# Patient Record
Sex: Female | Born: 1960 | Race: White | Hispanic: No | Marital: Married | Smoking: Never smoker
Health system: Southern US, Community
[De-identification: ages and names within clinical notes are randomized; demographics above are authoritative.]

## PROBLEM LIST (undated history)

## (undated) DIAGNOSIS — IMO0001 Reserved for inherently not codable concepts without codable children: Secondary | ICD-10-CM

## (undated) DIAGNOSIS — E88819 Insulin resistance, unspecified: Secondary | ICD-10-CM

## (undated) DIAGNOSIS — K219 Gastro-esophageal reflux disease without esophagitis: Secondary | ICD-10-CM

## (undated) DIAGNOSIS — E236 Other disorders of pituitary gland: Secondary | ICD-10-CM

## (undated) DIAGNOSIS — E8881 Metabolic syndrome: Secondary | ICD-10-CM

## (undated) DIAGNOSIS — N83209 Unspecified ovarian cyst, unspecified side: Secondary | ICD-10-CM

## (undated) DIAGNOSIS — G4730 Sleep apnea, unspecified: Secondary | ICD-10-CM

## (undated) DIAGNOSIS — Z9852 Vasectomy status: Secondary | ICD-10-CM

## (undated) DIAGNOSIS — M1990 Unspecified osteoarthritis, unspecified site: Secondary | ICD-10-CM

## (undated) DIAGNOSIS — G43909 Migraine, unspecified, not intractable, without status migrainosus: Secondary | ICD-10-CM

## (undated) DIAGNOSIS — I10 Essential (primary) hypertension: Secondary | ICD-10-CM

## (undated) HISTORY — PX: CHOLECYSTECTOMY: SHX55

## (undated) HISTORY — DX: Vasectomy status: Z98.52

## (undated) HISTORY — DX: Insulin resistance, unspecified: E88.819

## (undated) HISTORY — PX: HERNIA REPAIR: SHX51

## (undated) HISTORY — DX: Metabolic syndrome: E88.81

## (undated) HISTORY — DX: Essential (primary) hypertension: I10

## (undated) HISTORY — DX: Other disorders of pituitary gland: E23.6

## (undated) HISTORY — DX: Unspecified osteoarthritis, unspecified site: M19.90

## (undated) HISTORY — PX: ABDOMINAL SURGERY: SHX537

## (undated) HISTORY — DX: Unspecified ovarian cyst, unspecified side: N83.209

## (undated) HISTORY — DX: Reserved for inherently not codable concepts without codable children: IMO0001

## (undated) HISTORY — DX: Gastro-esophageal reflux disease without esophagitis: K21.9

---

## 1998-04-26 ENCOUNTER — Other Ambulatory Visit: Admission: RE | Admit: 1998-04-26 | Discharge: 1998-04-26 | Payer: Self-pay | Admitting: Gynecology

## 1998-11-17 ENCOUNTER — Other Ambulatory Visit: Admission: RE | Admit: 1998-11-17 | Discharge: 1998-11-17 | Payer: Self-pay | Admitting: Gynecology

## 1999-04-06 ENCOUNTER — Encounter: Payer: Self-pay | Admitting: Gynecology

## 1999-04-06 ENCOUNTER — Ambulatory Visit (HOSPITAL_COMMUNITY): Admission: RE | Admit: 1999-04-06 | Discharge: 1999-04-06 | Payer: Self-pay | Admitting: Gynecology

## 1999-09-29 ENCOUNTER — Encounter: Payer: Self-pay | Admitting: Family Medicine

## 1999-09-29 ENCOUNTER — Ambulatory Visit (HOSPITAL_COMMUNITY): Admission: RE | Admit: 1999-09-29 | Discharge: 1999-09-29 | Payer: Self-pay | Admitting: Family Medicine

## 2000-04-03 ENCOUNTER — Other Ambulatory Visit: Admission: RE | Admit: 2000-04-03 | Discharge: 2000-04-03 | Payer: Self-pay | Admitting: Gynecology

## 2001-04-30 ENCOUNTER — Other Ambulatory Visit: Admission: RE | Admit: 2001-04-30 | Discharge: 2001-04-30 | Payer: Self-pay | Admitting: Gynecology

## 2002-08-05 ENCOUNTER — Other Ambulatory Visit: Admission: RE | Admit: 2002-08-05 | Discharge: 2002-08-05 | Payer: Self-pay | Admitting: Obstetrics and Gynecology

## 2002-11-24 HISTORY — PX: VAGINAL HYSTERECTOMY: SUR661

## 2002-12-01 ENCOUNTER — Encounter (INDEPENDENT_AMBULATORY_CARE_PROVIDER_SITE_OTHER): Payer: Self-pay

## 2002-12-01 ENCOUNTER — Inpatient Hospital Stay (HOSPITAL_COMMUNITY): Admission: RE | Admit: 2002-12-01 | Discharge: 2002-12-03 | Payer: Self-pay | Admitting: Obstetrics and Gynecology

## 2003-08-19 ENCOUNTER — Other Ambulatory Visit: Admission: RE | Admit: 2003-08-19 | Discharge: 2003-08-19 | Payer: Self-pay | Admitting: Obstetrics and Gynecology

## 2004-08-22 ENCOUNTER — Other Ambulatory Visit: Admission: RE | Admit: 2004-08-22 | Discharge: 2004-08-22 | Payer: Self-pay | Admitting: Obstetrics and Gynecology

## 2005-08-23 ENCOUNTER — Other Ambulatory Visit: Admission: RE | Admit: 2005-08-23 | Discharge: 2005-08-23 | Payer: Self-pay | Admitting: Obstetrics and Gynecology

## 2006-08-24 ENCOUNTER — Other Ambulatory Visit: Admission: RE | Admit: 2006-08-24 | Discharge: 2006-08-24 | Payer: Self-pay | Admitting: Obstetrics and Gynecology

## 2007-09-09 ENCOUNTER — Other Ambulatory Visit: Admission: RE | Admit: 2007-09-09 | Discharge: 2007-09-09 | Payer: Self-pay | Admitting: Obstetrics and Gynecology

## 2008-09-09 ENCOUNTER — Other Ambulatory Visit: Admission: RE | Admit: 2008-09-09 | Discharge: 2008-09-09 | Payer: Self-pay | Admitting: Obstetrics and Gynecology

## 2008-09-09 ENCOUNTER — Encounter: Payer: Self-pay | Admitting: Obstetrics and Gynecology

## 2008-09-09 ENCOUNTER — Ambulatory Visit: Payer: Self-pay | Admitting: Obstetrics and Gynecology

## 2009-09-13 ENCOUNTER — Ambulatory Visit: Payer: Self-pay | Admitting: Obstetrics and Gynecology

## 2009-09-13 ENCOUNTER — Other Ambulatory Visit: Admission: RE | Admit: 2009-09-13 | Discharge: 2009-09-13 | Payer: Self-pay | Admitting: Obstetrics and Gynecology

## 2010-09-27 ENCOUNTER — Ambulatory Visit
Admission: RE | Admit: 2010-09-27 | Discharge: 2010-09-27 | Payer: Self-pay | Source: Home / Self Care | Attending: Obstetrics and Gynecology | Admitting: Obstetrics and Gynecology

## 2010-09-27 ENCOUNTER — Other Ambulatory Visit
Admission: RE | Admit: 2010-09-27 | Discharge: 2010-09-27 | Payer: Self-pay | Source: Home / Self Care | Admitting: Obstetrics and Gynecology

## 2011-02-10 NOTE — Op Note (Signed)
NAME:  Janice Wagner, Janice Wagner                        ACCOUNT NO.:  [id number]   MEDICAL RECORD NO.:  [id number]                   PATIENT TYPE:  INP   LOCATION:  X001                                 FACILITY:  [facility]   PHYSICIAN:  Daniel L. Eda Paschal, M.D.           DATE OF BIRTH:  [birth year]   DATE OF PROCEDURE:  12/01/2002  DATE OF DISCHARGE:                                 OPERATIVE REPORT   PREOPERATIVE DIAGNOSES:  1. Uterine prolapse.  2. Rectocele.   POSTOPERATIVE DIAGNOSES:  1. Uterine prolapse.  2. Rectocele.   OPERATION:  1. Vaginal hysterectomy.  2. Posterior repair.   SURGEON:  Daniel L. Eda Paschal, M.D.   FIRST ASSISTANT:  Ivor Costa. Farrel Gobble, M.D.   FINDINGS:  At the time of surgery, the patient had second-degree uterine  descensus.  Ovaries and tubes were normal.  Pelvic peritoneum was normal.  The patient also has a second-degree rectocele.  Once the uterus was out,  the patient had a completely normal anterior wall, including a good  urethrovesical angle.   DESCRIPTION OF PROCEDURE:  After adequate general endotracheal anesthesia,  the patient was placed in the dorsal lithotomy position and prepped and  draped in the usual sterile manner.  A 1:200,000 solution of epinephrine was  injected around the cervix using 0.5% Xylocaine as a base.  A 360 degree  incision was made around the cervix.  Bladder and posterior peritoneum were  mobilized superiorly.  Posterior peritoneum and vesicouterine fold of  perineum were entered with sharp dissection.  The uterosacral ligaments were  clamped.  In clamping them, they were shortened, and they were sutured to  the vault laterally for good vault support.  Cardinal ligaments, uterine  arteries, balance of the broad ligament, uteroovarian ligaments, round  ligaments, and fallopian tubes were successively clamped, cut, and suture  ligated, and then the uterus was delivered and sent to pathology for tissue  diagnosis.  Suture  material for the above-mentioned pedicles was #1 chromic  catgut.  Copious irrigation was then done with Ringer's lactate.  The  vaginal cuff was sutured to the peritoneum with a running locking 0 Vicryl.  A modified McCall's enterocele prevention suture was placed with 3-0 Vicryl.  The cuff was closed with figure-of-eights of #1 chromic catgut, and the  enterocele prevention suture was tied in place.  The patient had an  excellent anterior wall at this point and therefore, no anterior repair was  done.  The perineum on the patient was also normal, but she had a large  rectocele.  Starting at the mucocutaneous junction, the posterior vaginal  mucosa was undermined to the top of the cuff.  It was sharply dissected free  from the rectocele.  This surgeon put his finger in the rectum to facilitate  dissection.  Once the rectocele was dissected free, it was eliminated with  eight interrupted sutures of 2-0 Vicryl.  Redundant vaginal mucosa was  trimmed away, and then the vaginal mucosa was closed with a running 2-0  Vicryl, also picking up perirectal fascia underneath for elimination of dead  space.  A Foley catheter was then placed  which drained 100 mL of clear urine.  A pack was placed.  The patient  tolerated the procedure well and left the operating room in satisfactory  condition draining clear urine from her Foley catheter.  Estimated blood  loss was 100 mL with none replaced.                                               Daniel L. Eda Paschal, M.D.    Tonette Bihari  D:  12/01/2002  T:  12/01/2002  Job:  [id number]

## 2011-02-10 NOTE — Discharge Summary (Signed)
   NAME:  Janice Wagner, ARKIN                        ACCOUNT NO.:  [id number]   MEDICAL RECORD NO.:  [id number]                   PATIENT TYPE:  INP   LOCATION:  0446                                 FACILITY:  [facility]   PHYSICIAN:  Daniel L. Eda Paschal, M.D.           DATE OF BIRTH:  07-31-1961   DATE OF ADMISSION:  12/01/2002  DATE OF DISCHARGE:  12/03/2002                                 DISCHARGE SUMMARY   HISTORY OF PRESENT ILLNESS:  The patient is a 50 year old female who was  admitted to the hospital with symptomatic uterine prolapse and rectocele for  surgery.   HOSPITAL COURSE:  On the day of admission she underwent a vaginal  hysterectomy, posterior repair.  Postoperatively she had a little trouble  with p.o. intake and, therefore, her IVs were continued.  By the second  postoperative day she was now tolerating p.o. well, she was voiding well,  and she was ready for discharge.   DISCHARGE MEDICATIONS:  Tylox for pain relief and a stool softener.   FOLLOW-UP:  She is to return to the office in three weeks.   PATHOLOGY:  Final pathology report was benign.   CONDITION ON DISCHARGE:  Improved.   DISCHARGE DIAGNOSES:  1. Symptomatic uterine prolapse.  2. Symptomatic rectocele.   OPERATION:  Vaginal hysterectomy, posterior repair.                                               Daniel L. Eda Paschal, M.D.    Tonette Bihari  D:  12/03/2002  T:  12/03/2002  Job:  [id number]
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
## 2011-02-16 ENCOUNTER — Ambulatory Visit (INDEPENDENT_AMBULATORY_CARE_PROVIDER_SITE_OTHER): Payer: BC Managed Care – PPO | Admitting: Obstetrics and Gynecology

## 2011-02-16 DIAGNOSIS — N951 Menopausal and female climacteric states: Secondary | ICD-10-CM

## 2011-11-23 DIAGNOSIS — M1990 Unspecified osteoarthritis, unspecified site: Secondary | ICD-10-CM | POA: Insufficient documentation

## 2011-11-23 DIAGNOSIS — E236 Other disorders of pituitary gland: Secondary | ICD-10-CM | POA: Insufficient documentation

## 2011-11-23 DIAGNOSIS — Z9852 Vasectomy status: Secondary | ICD-10-CM | POA: Insufficient documentation

## 2011-11-23 DIAGNOSIS — E8881 Metabolic syndrome: Secondary | ICD-10-CM | POA: Insufficient documentation

## 2011-11-30 ENCOUNTER — Other Ambulatory Visit (HOSPITAL_COMMUNITY)
Admission: RE | Admit: 2011-11-30 | Discharge: 2011-11-30 | Disposition: A | Discharge status: Home / Self Care | Payer: BC Managed Care – PPO | Source: Ambulatory Visit | Attending: Obstetrics and Gynecology | Admitting: Obstetrics and Gynecology

## 2011-11-30 ENCOUNTER — Ambulatory Visit (INDEPENDENT_AMBULATORY_CARE_PROVIDER_SITE_OTHER): Payer: BC Managed Care – PPO | Admitting: Obstetrics and Gynecology

## 2011-11-30 ENCOUNTER — Encounter: Payer: Self-pay | Admitting: Obstetrics and Gynecology

## 2011-11-30 VITALS — BP 124/76 | Ht 65.0 in | Wt 204.0 lb

## 2011-11-30 DIAGNOSIS — Z01419 Encounter for gynecological examination (general) (routine) without abnormal findings: Secondary | ICD-10-CM

## 2011-11-30 DIAGNOSIS — N83209 Unspecified ovarian cyst, unspecified side: Secondary | ICD-10-CM | POA: Insufficient documentation

## 2011-11-30 MED ORDER — ESTRADIOL 0.05 MG/24HR TD PTTW: 1.0000 | MEDICATED_PATCH | TRANSDERMAL | Status: DC

## 2011-11-30 NOTE — Progress Notes (Signed)
Patient came to see me today for her annual GYN exam. She is happy with her estrogen patch except for skin irritation. She is having no bleeding. She is having no pelvic pain. She is up-to-date on mammograms. She had normal bone density last year. She does her lab through PCP.  HEENT: Within normal limits. Kennon Portela present. Neck: No masses. Supraclavicular lymph nodes: Not enlarged. Breasts: Examined in both sitting and lying position. Symmetrical without skin changes or masses. Abdomen: Soft no masses guarding or rebound. No hernias. Pelvic: External within normal limits. BUS within normal limits. Vaginal examination shows good estrogen effect, no cystocele enterocele or rectocele. Cervix and uterus absent. Adnexa within normal limits. Rectovaginal confirmatory. Extremities within normal limits.  Assessment: Menopausal symptoms relieved with estrogen.  Plan: Switch to mini Vivelle 0.05 mg twice a week  to see if that helps skin irritation. Continue yearly mammograms.

## 2011-12-01 LAB — URINALYSIS W MICROSCOPIC + REFLEX CULTURE
Casts: NONE SEEN
Hgb urine dipstick: NEGATIVE
Leukocytes, UA: NEGATIVE
Nitrite: NEGATIVE
Specific Gravity, Urine: 1.006 (ref 1.005–1.030)
Urobilinogen, UA: 0.2 mg/dL (ref 0.0–1.0)
pH: 6.5 (ref 5.0–8.0)

## 2012-03-07 ENCOUNTER — Encounter: Payer: Self-pay | Admitting: Obstetrics and Gynecology

## 2012-12-30 ENCOUNTER — Other Ambulatory Visit: Payer: Self-pay | Admitting: Obstetrics and Gynecology

## 2012-12-30 NOTE — Telephone Encounter (Signed)
Pt to be called to schedule annual exam KW

## 2014-07-27 ENCOUNTER — Encounter: Payer: Self-pay | Admitting: Obstetrics and Gynecology

## 2020-03-26 ENCOUNTER — Other Ambulatory Visit: Payer: Self-pay | Admitting: Sports Medicine

## 2020-03-26 ENCOUNTER — Ambulatory Visit: Payer: BC Managed Care – PPO | Admitting: Sports Medicine

## 2020-03-26 ENCOUNTER — Ambulatory Visit (INDEPENDENT_AMBULATORY_CARE_PROVIDER_SITE_OTHER): Payer: BC Managed Care – PPO

## 2020-03-26 ENCOUNTER — Encounter: Payer: Self-pay | Admitting: Sports Medicine

## 2020-03-26 ENCOUNTER — Other Ambulatory Visit: Payer: Self-pay

## 2020-03-26 DIAGNOSIS — M7741 Metatarsalgia, right foot: Secondary | ICD-10-CM

## 2020-03-26 DIAGNOSIS — M779 Enthesopathy, unspecified: Secondary | ICD-10-CM

## 2020-03-26 DIAGNOSIS — E119 Type 2 diabetes mellitus without complications: Secondary | ICD-10-CM

## 2020-03-26 DIAGNOSIS — M79671 Pain in right foot: Secondary | ICD-10-CM

## 2020-03-26 DIAGNOSIS — M79672 Pain in left foot: Secondary | ICD-10-CM

## 2020-03-26 MED ORDER — TRIAMCINOLONE ACETONIDE 10 MG/ML IJ SUSP
10.0000 mg | Freq: Once | INTRAMUSCULAR | Status: AC
  Administered 2020-03-26: 10 mg

## 2020-03-26 NOTE — Progress Notes (Signed)
Subjective: Janice Wagner is a 59 y.o. female patient who presents to office for evaluation of right foot pain. Patient complains of progressive pain especially over the last 4 months since March in the right foot at the ball. Ranks pain 3-7/10 and is now interferring with daily activities. Patient has tried nothing with no relief in symptoms. Patient denies any other pedal complaints. Denies known injury/trip/fall/sprain/any causative factors.   Review of Systems  All other systems reviewed and are negative.   Patient Active Problem List   Diagnosis Date Noted  . Ovarian cyst   . Reflux   . Arthritis   . Insulin resistance   . Empty sella syndrome ([place])   . H/O: vasectomy     Current Outpatient Medications on File Prior to Visit  Medication Sig Dispense Refill  . amLODipine (NORVASC) 5 MG tablet Take 5 mg by mouth daily.    . celecoxib (CELEBREX) 200 MG capsule Take 200 mg by mouth daily.    . Cetirizine HCl (ZYRTEC ALLERGY PO) Take by mouth.    . DULoxetine (CYMBALTA) 30 MG capsule Take 30 mg by mouth daily.    Marland Kitchen lisinopril (ZESTRIL) 20 MG tablet Take 20 mg by mouth daily.    . MECLIZINE HCL PO Take by mouth.    . metFORMIN (GLUCOPHAGE-XR) 500 MG 24 hr tablet Take 500 mg by mouth 2 (two) times daily.    Marland Kitchen omeprazole (PRILOSEC) 20 MG capsule Take 20 mg by mouth 2 (two) times daily.    Marland Kitchen topiramate (TOPAMAX) 25 MG tablet Take 25 mg by mouth daily.     No current facility-administered medications on file prior to visit.    Allergies  Allergen Reactions  . Penicillins     Objective:  General: Alert and oriented x3 in no acute distress  Dermatology: No open lesions bilateral lower extremities, no webspace macerations, no ecchymosis bilateral, all nails x 10 are well manicured.  Vascular: Dorsalis Pedis and Posterior Tibial pedal pulses palpable, Capillary Fill Time 3 seconds,(+) pedal hair growth bilateral, no edema bilateral lower extremities, Temperature gradient within  normal limits.  Neurology: Johney Maine sensation intact via light touch bilateral.   Musculoskeletal: Mild tenderness with palpation at ball of the right foot sub met 2, Mild early hammertoe deformity at 2nd toe. Strength within normal limits in all groups bilateral.   Gait: Antalgic gait  Xrays  Right Foot   Impression:Mild 2nd toe deviation, early hammertoe deformity,no fracture, no other acute findings.  Assessment and Plan: Problem List Items Addressed This Visit    None    Visit Diagnoses    Capsulitis    -  Primary   Metatarsalgia, right foot       Right foot pain       Diabetes mellitus without complication (HCC)       Relevant Medications   lisinopril (ZESTRIL) 20 MG tablet   metFORMIN (GLUCOPHAGE-XR) 500 MG 24 hr tablet       -Complete examination performed -Xrays reviewed -Discussed treatment options -After oral consent and aseptic prep, injected a mixture containing 1 ml of 2%  plain lidocaine, 1 ml 0.5% plain marcaine, 0.5 ml of kenalog 10 and 0.5 ml of dexamethasone phosphate into right 2nd mtpj without complication. Post-injection care discussed with patient.  -Dispensed metatarsal padding and good supportive shoes -Advised patient to avoid strenous  -Recommend icing until symptoms improve -Patient to return to office in 1 month or sooner if condition worsens.  Landis Martins, DPM

## 2020-04-09 ENCOUNTER — Other Ambulatory Visit: Payer: Self-pay | Admitting: Sports Medicine

## 2020-04-09 DIAGNOSIS — M779 Enthesopathy, unspecified: Secondary | ICD-10-CM

## 2020-04-23 ENCOUNTER — Other Ambulatory Visit: Payer: Self-pay

## 2020-04-23 ENCOUNTER — Encounter: Payer: Self-pay | Admitting: Sports Medicine

## 2020-04-23 ENCOUNTER — Ambulatory Visit: Payer: BC Managed Care – PPO | Admitting: Sports Medicine

## 2020-04-23 DIAGNOSIS — M7741 Metatarsalgia, right foot: Secondary | ICD-10-CM | POA: Diagnosis not present

## 2020-04-23 DIAGNOSIS — M79671 Pain in right foot: Secondary | ICD-10-CM

## 2020-04-23 DIAGNOSIS — M779 Enthesopathy, unspecified: Secondary | ICD-10-CM

## 2020-04-23 DIAGNOSIS — E119 Type 2 diabetes mellitus without complications: Secondary | ICD-10-CM

## 2020-04-23 DIAGNOSIS — M722 Plantar fascial fibromatosis: Secondary | ICD-10-CM

## 2020-04-23 NOTE — Progress Notes (Signed)
Subjective: Janice Wagner is a 59 y.o. female patient who returns to office for follow up evaluation of right foot pain. Patient reports that pain is much better 1-2/10. Has been resting it is not walking as much using reports pain is much better than it was.  Patient reports that she has been icing as well.  Patient denies any other pedal complaints.    Patient Active Problem List   Diagnosis Date Noted  . Ovarian cyst   . Reflux   . Arthritis   . Insulin resistance   . Empty sella syndrome ([place])   . H/O: vasectomy     Current Outpatient Medications on File Prior to Visit  Medication Sig Dispense Refill  . amLODipine (NORVASC) 5 MG tablet Take 5 mg by mouth daily.    . celecoxib (CELEBREX) 200 MG capsule Take 200 mg by mouth daily.    . Cetirizine HCl (ZYRTEC ALLERGY PO) Take by mouth.    . DULoxetine (CYMBALTA) 30 MG capsule Take 30 mg by mouth daily.    Marland Kitchen lisinopril (ZESTRIL) 20 MG tablet Take 20 mg by mouth daily.    . MECLIZINE HCL PO Take by mouth.    . metFORMIN (GLUCOPHAGE-XR) 500 MG 24 hr tablet Take 500 mg by mouth 2 (two) times daily.    Marland Kitchen omeprazole (PRILOSEC) 20 MG capsule Take 20 mg by mouth 2 (two) times daily.    Marland Kitchen topiramate (TOPAMAX) 25 MG tablet Take 25 mg by mouth daily.     No current facility-administered medications on file prior to visit.    Allergies  Allergen Reactions  . Penicillins     Objective:  General: Alert and oriented x3 in no acute distress  Dermatology: No open lesions bilateral lower extremities, no webspace macerations, no ecchymosis bilateral, all nails x 10 are well manicured.  Vascular: Dorsalis Pedis and Posterior Tibial pedal pulses palpable, Capillary Fill Time 3 seconds,(+) pedal hair growth bilateral, no edema bilateral lower extremities, Temperature gradient within normal limits.  Neurology: Johney Maine sensation intact via light touch bilateral.   Musculoskeletal: No reproducible tenderness with palpation at ball of the right  foot sub met 2, or distal plantar fascia, mild early hammertoe deformity at 2nd toe. Strength within normal limits in all groups bilateral.    Assessment and Plan: Problem List Items Addressed This Visit    None    Visit Diagnoses    Capsulitis    -  Primary   Metatarsalgia, right foot       Right foot pain       Diabetes mellitus without complication (HCC)       Plantar fasciitis of right foot       Distal       -Complete examination performed -Re-discussed care for right foot pain -Recommend custom function foot orthotics to prevent recurrence of symptoms meanwhile continue with metatarsal padding and good supportive shoes -Advised patient may resume walking to tolerance may need to ice afterwards -Patient to return to office for orthotics or sooner if condition worsens.  Landis Martins, DPM

## 2020-05-11 ENCOUNTER — Other Ambulatory Visit: Payer: Self-pay

## 2020-05-11 ENCOUNTER — Other Ambulatory Visit: Payer: BC Managed Care – PPO | Admitting: Orthotics

## 2020-05-11 DIAGNOSIS — E119 Type 2 diabetes mellitus without complications: Secondary | ICD-10-CM | POA: Diagnosis not present

## 2020-05-11 DIAGNOSIS — M722 Plantar fascial fibromatosis: Secondary | ICD-10-CM

## 2020-05-11 DIAGNOSIS — M779 Enthesopathy, unspecified: Secondary | ICD-10-CM | POA: Diagnosis not present

## 2020-07-06 ENCOUNTER — Ambulatory Visit: Payer: BC Managed Care – PPO | Admitting: Orthotics

## 2020-07-06 ENCOUNTER — Other Ambulatory Visit: Payer: Self-pay

## 2020-07-06 DIAGNOSIS — M7741 Metatarsalgia, right foot: Secondary | ICD-10-CM

## 2020-07-06 DIAGNOSIS — M779 Enthesopathy, unspecified: Secondary | ICD-10-CM

## 2020-07-06 NOTE — Progress Notes (Signed)
Patient came in today to pick up custom made foot orthotics.  The goals were accomplished and the patient reported no dissatisfaction with said orthotics.  Patient was advised of breakin period and how to report any issues. 

## 2020-07-07 ENCOUNTER — Other Ambulatory Visit: Payer: BC Managed Care – PPO | Admitting: Orthotics

## 2021-11-03 ENCOUNTER — Other Ambulatory Visit: Payer: Self-pay | Admitting: Specialist

## 2021-11-03 DIAGNOSIS — R296 Repeated falls: Secondary | ICD-10-CM

## 2021-11-03 DIAGNOSIS — K529 Noninfective gastroenteritis and colitis, unspecified: Secondary | ICD-10-CM

## 2021-11-03 DIAGNOSIS — M25551 Pain in right hip: Secondary | ICD-10-CM

## 2021-11-03 DIAGNOSIS — M545 Low back pain, unspecified: Secondary | ICD-10-CM

## 2021-11-03 DIAGNOSIS — K580 Irritable bowel syndrome with diarrhea: Secondary | ICD-10-CM

## 2021-11-03 DIAGNOSIS — R110 Nausea: Secondary | ICD-10-CM

## 2021-11-04 ENCOUNTER — Other Ambulatory Visit: Payer: Self-pay | Admitting: Specialist

## 2021-11-04 DIAGNOSIS — K580 Irritable bowel syndrome with diarrhea: Secondary | ICD-10-CM

## 2021-11-04 DIAGNOSIS — R110 Nausea: Secondary | ICD-10-CM

## 2021-11-19 ENCOUNTER — Ambulatory Visit
Admission: RE | Admit: 2021-11-19 | Discharge: 2021-11-19 | Disposition: A | Discharge status: Home / Self Care | Payer: BC Managed Care – PPO | Source: Ambulatory Visit | Attending: Specialist | Admitting: Specialist

## 2021-11-19 ENCOUNTER — Other Ambulatory Visit: Payer: Self-pay

## 2021-11-19 DIAGNOSIS — R110 Nausea: Secondary | ICD-10-CM

## 2021-11-19 DIAGNOSIS — M25552 Pain in left hip: Secondary | ICD-10-CM

## 2021-11-19 DIAGNOSIS — M545 Low back pain, unspecified: Secondary | ICD-10-CM

## 2021-11-19 DIAGNOSIS — M25551 Pain in right hip: Secondary | ICD-10-CM

## 2021-11-19 DIAGNOSIS — K529 Noninfective gastroenteritis and colitis, unspecified: Secondary | ICD-10-CM

## 2021-11-19 DIAGNOSIS — K580 Irritable bowel syndrome with diarrhea: Secondary | ICD-10-CM

## 2021-11-19 DIAGNOSIS — R296 Repeated falls: Secondary | ICD-10-CM

## 2021-11-19 MED ORDER — GADOBENATE DIMEGLUMINE 529 MG/ML IV SOLN
20.0000 mL | Freq: Once | INTRAVENOUS | Status: AC | PRN
  Administered 2021-11-19: 20 mL via INTRAVENOUS

## 2021-11-25 ENCOUNTER — Encounter: Payer: Self-pay | Admitting: Gastroenterology

## 2021-12-26 ENCOUNTER — Ambulatory Visit: Payer: BC Managed Care – PPO | Admitting: Gastroenterology

## 2022-06-19 ENCOUNTER — Other Ambulatory Visit: Payer: Self-pay | Admitting: Radiology

## 2022-06-20 ENCOUNTER — Encounter: Payer: Self-pay | Admitting: Specialist

## 2022-06-21 ENCOUNTER — Telehealth: Payer: Self-pay | Admitting: Hematology

## 2022-06-21 NOTE — Telephone Encounter (Signed)
Spoke to patient to confirm afternoon clinic appointment for 10/4, paperwork will be sent by [facility]

## 2022-06-26 ENCOUNTER — Encounter: Payer: Self-pay | Admitting: *Deleted

## 2022-06-26 DIAGNOSIS — C50411 Malignant neoplasm of upper-outer quadrant of right female breast: Secondary | ICD-10-CM | POA: Insufficient documentation

## 2022-06-26 NOTE — Progress Notes (Signed)
Radiation Oncology         [phone] ________________________________  Name: Janice Wagner        MRN: [id number]  Date of Service: 06/28/2022 DOB: 07/07/1961  [id number], Clarita Crane., MD  Coralie Keens, MD     REFERRING PHYSICIAN: Coralie Keens, MD   DIAGNOSIS: The encounter diagnosis was Malignant neoplasm of upper-outer quadrant of right breast in female, estrogen receptor positive ([place]).   HISTORY OF PRESENT ILLNESS: Janice Wagner is a 61 y.o. female seen in the multidisciplinary breast clinic for a new diagnosis of right breast cancer. The patient was noted to have a screening detected mass in the right breast.  She underwent further diagnostic work-up including ultrasound which identified a 1.8 cm mass with microlobulated margin in the right breast at the 12 o'clock position and an adjacent 5 mm mass also in the 12 o'clock position was noted.  There was also a 1.7 cm oval mass with microlobulated margin in the right breast in the 9-9 30 o'clock position and superior to this a 7 mm mass from 9 30-10 o'clock.  There were 2 additional small 4 and 5 mm hypoechoic masses between 9-10 o'clock.  The axilla was negative for adenopathy.  She underwent a biopsy on 06/19/2022 the 12:00 specimen showed a grade 2 invasive ductal carcinoma with microcalcifications, this was ER/PR positive, HER2 was negative with a Ki-67 of 10%. The  9:30 biopsy showed benign breast tissue with fibrocystic changes stromal fibrosis and usual ductal hyperplasia with posh no evidence of malignancy was present.  The 10:00 lesion was also compatible with a benign fibroadenoma with rare microcalcifications present negative for malignancy.  She is seen today to discuss treatment recommendations for her cancer.Marland Kitchen    PREVIOUS RADIATION THERAPY: {EXAM; YES/NO:19492::"No"}   PAST MEDICAL HISTORY:  Past Medical History:  Diagnosis Date   Arthritis    Empty sella syndrome ([place])    H/O: vasectomy    husband with  vasectomy   Insulin resistance    Ovarian cyst    Reflux        PAST SURGICAL HISTORY: Past Surgical History:  Procedure Laterality Date   ABDOMINAL SURGERY     Laparotomy-Ovarian cyst   CHOLECYSTECTOMY     HERNIA REPAIR     VAGINAL HYSTERECTOMY  11/2002   VAGINAL HYST., POSTERIOR REPAIR     FAMILY HISTORY:  Family History  Problem Relation Age of Onset   Breast cancer Maternal Grandmother        Age 76   Hypertension Maternal Grandfather    Cancer Maternal Uncle        Colon cancer     SOCIAL HISTORY:  reports that she has never smoked. She does not have any smokeless tobacco history on file. She reports current alcohol use.  The patient is married and lives in [place].   ALLERGIES: Penicillins   MEDICATIONS:  Current Outpatient Medications  Medication Sig Dispense Refill   amLODipine (NORVASC) 5 MG tablet Take 5 mg by mouth daily.     celecoxib (CELEBREX) 200 MG capsule Take 200 mg by mouth daily.     Cetirizine HCl (ZYRTEC ALLERGY PO) Take by mouth.     DULoxetine (CYMBALTA) 30 MG capsule Take 30 mg by mouth daily.     lisinopril (ZESTRIL) 20 MG tablet Take 20 mg by mouth daily.     MECLIZINE HCL PO Take by mouth.     metFORMIN (GLUCOPHAGE-XR) 500 MG 24 hr tablet Take 500  mg by mouth 2 (two) times daily.     omeprazole (PRILOSEC) 20 MG capsule Take 20 mg by mouth 2 (two) times daily.     topiramate (TOPAMAX) 25 MG tablet Take 25 mg by mouth daily.     No current facility-administered medications for this visit.     REVIEW OF SYSTEMS: On review of systems, the patient reports that she is doing ***     PHYSICAL EXAM:  Wt Readings from Last 3 Encounters:  11/30/11 204 lb (92.5 kg)   Temp Readings from Last 3 Encounters:  No data found for Temp   BP Readings from Last 3 Encounters:  11/30/11 124/76   Pulse Readings from Last 3 Encounters:  No data found for Pulse  ***  In general this is a well appearing *** female in no acute distress.  She's alert and oriented x4 and appropriate throughout the examination. Cardiopulmonary assessment is negative for acute distress and she exhibits normal effort. Bilateral breast exam is deferred.    ECOG = ***  0 - Asymptomatic (Fully active, able to carry on all predisease activities without restriction)  1 - Symptomatic but completely ambulatory (Restricted in physically strenuous activity but ambulatory and able to carry out work of a light or sedentary nature. For example, light housework, office work)  2 - Symptomatic, <50% in bed during the day (Ambulatory and capable of all self care but unable to carry out any work activities. Up and about more than 50% of waking hours)  3 - Symptomatic, >50% in bed, but not bedbound (Capable of only limited self-care, confined to bed or chair 50% or more of waking hours)  4 - Bedbound (Completely disabled. Cannot carry on any self-care. Totally confined to bed or chair)  5 - Death   Eustace Pen MM, Creech RH, Tormey DC, et al. [phone]). "Toxicity and response criteria of the [facility]". [place] Oncol. 5 (6): 649-55    LABORATORY DATA:  No results found for: "WBC", "HGB", "HCT", "MCV", "PLT" No results found for: "NA", "K", "CL", "CO2" No results found for: "ALT", "AST", "GGT", "ALKPHOS", "BILITOT"    RADIOGRAPHY: No results found.     IMPRESSION/PLAN: 1. Stage IA, cT1cN0M0, grade 2, ER/PR positive invasive ductal carcinoma of the right breast. Dr. Lisbeth Renshaw discusses the pathology findings and reviews the nature of early stage right breast disease. The consensus from the breast conference includes breast conservation with lumpectomy with *** sentinel node biopsy. Dr. Burr Medico anticipates Oncotype Dx score to determine a role for systemic therapy. Provided that chemotherapy is not indicated, the patient's course would then be followed by external radiotherapy to the breast  to reduce risks of local recurrence followed by  antiestrogen therapy. We discussed the risks, benefits, short, and long term effects of radiotherapy, as well as the curative intent, and the patient is interested in proceeding. Dr. Lisbeth Renshaw discusses the delivery and logistics of radiotherapy and anticipates a course of 4 or up to 6 1/2 weeks of radiotherapy to the right breast. We will see her back a few weeks after surgery to discuss the simulation process and anticipate we starting radiotherapy about 4-6 weeks after surgery.  2. Possible genetic predisposition to malignancy. The patient is a candidate for genetic testing given her personal and family history. She will meet with our geneticist today in clinic.   In a visit lasting *** minutes, greater than 50% of the time was spent face to face reviewing her case, as well  as in preparation of, discussing, and coordinating the patient's care.  The above documentation reflects my direct findings during this shared patient visit. Please see the separate note by Dr. Lisbeth Renshaw on this date for the remainder of the patient's plan of care.    Carola Rhine, [facility]    **Disclaimer: This note was dictated with voice recognition software. Similar sounding words can inadvertently be transcribed and this note may contain transcription errors which may not have been corrected upon publication of note.**

## 2022-06-28 ENCOUNTER — Ambulatory Visit
Admission: RE | Admit: 2022-06-28 | Discharge: 2022-06-28 | Disposition: A | Discharge status: Home / Self Care | Payer: BC Managed Care – PPO | Source: Ambulatory Visit | Attending: Radiation Oncology | Admitting: Radiation Oncology

## 2022-06-28 ENCOUNTER — Encounter: Payer: Self-pay | Admitting: Emergency Medicine

## 2022-06-28 ENCOUNTER — Inpatient Hospital Stay (HOSPITAL_BASED_OUTPATIENT_CLINIC_OR_DEPARTMENT_OTHER): Payer: BC Managed Care – PPO | Admitting: Genetic Counselor

## 2022-06-28 ENCOUNTER — Encounter: Payer: Self-pay | Admitting: Genetic Counselor

## 2022-06-28 ENCOUNTER — Inpatient Hospital Stay: Payer: BC Managed Care – PPO | Attending: Hematology

## 2022-06-28 ENCOUNTER — Encounter: Payer: Self-pay | Admitting: Physical Therapy

## 2022-06-28 ENCOUNTER — Other Ambulatory Visit: Payer: Self-pay

## 2022-06-28 ENCOUNTER — Other Ambulatory Visit: Payer: Self-pay | Admitting: Surgery

## 2022-06-28 ENCOUNTER — Ambulatory Visit: Payer: BC Managed Care – PPO | Attending: Surgery | Admitting: Physical Therapy

## 2022-06-28 ENCOUNTER — Encounter: Payer: Self-pay | Admitting: Hematology

## 2022-06-28 ENCOUNTER — Inpatient Hospital Stay (HOSPITAL_BASED_OUTPATIENT_CLINIC_OR_DEPARTMENT_OTHER): Payer: BC Managed Care – PPO | Admitting: Hematology

## 2022-06-28 VITALS — BP 139/77 | HR 85 | Temp 97.8°F | Ht 65.0 in | Wt 215.5 lb

## 2022-06-28 DIAGNOSIS — Z803 Family history of malignant neoplasm of breast: Secondary | ICD-10-CM

## 2022-06-28 DIAGNOSIS — Z853 Personal history of malignant neoplasm of breast: Secondary | ICD-10-CM

## 2022-06-28 DIAGNOSIS — Z800 Family history of malignant neoplasm of digestive organs: Secondary | ICD-10-CM | POA: Diagnosis not present

## 2022-06-28 DIAGNOSIS — C50411 Malignant neoplasm of upper-outer quadrant of right female breast: Secondary | ICD-10-CM | POA: Insufficient documentation

## 2022-06-28 DIAGNOSIS — Z170 Estrogen receptor positive status [ER+]: Secondary | ICD-10-CM

## 2022-06-28 DIAGNOSIS — Z90710 Acquired absence of both cervix and uterus: Secondary | ICD-10-CM

## 2022-06-28 DIAGNOSIS — I10 Essential (primary) hypertension: Secondary | ICD-10-CM | POA: Insufficient documentation

## 2022-06-28 DIAGNOSIS — R293 Abnormal posture: Secondary | ICD-10-CM | POA: Insufficient documentation

## 2022-06-28 HISTORY — DX: Family history of malignant neoplasm of digestive organs: Z80.0

## 2022-06-28 HISTORY — DX: Family history of malignant neoplasm of breast: Z80.3

## 2022-06-28 LAB — CBC WITH DIFFERENTIAL (CANCER CENTER ONLY)
Abs Immature Granulocytes: 0.03 10*3/uL (ref 0.00–0.07)
Basophils Absolute: 0.1 10*3/uL (ref 0.0–0.1)
Basophils Relative: 1 %
Eosinophils Absolute: 0.1 10*3/uL (ref 0.0–0.5)
Eosinophils Relative: 1 %
HCT: 39.6 % (ref 36.0–46.0)
Hemoglobin: 12.6 g/dL (ref 12.0–15.0)
Immature Granulocytes: 0 %
Lymphocytes Relative: 17 %
Lymphs Abs: 1.7 10*3/uL (ref 0.7–4.0)
MCH: 26.5 pg (ref 26.0–34.0)
MCHC: 31.8 g/dL (ref 30.0–36.0)
MCV: 83.2 fL (ref 80.0–100.0)
Monocytes Absolute: 0.6 10*3/uL (ref 0.1–1.0)
Monocytes Relative: 6 %
Neutro Abs: 7.3 10*3/uL (ref 1.7–7.7)
Neutrophils Relative %: 75 %
Platelet Count: 386 10*3/uL (ref 150–400)
RBC: 4.76 MIL/uL (ref 3.87–5.11)
RDW: 15.2 % (ref 11.5–15.5)
WBC Count: 9.7 10*3/uL (ref 4.0–10.5)
nRBC: 0 % (ref 0.0–0.2)

## 2022-06-28 LAB — CMP (CANCER CENTER ONLY)
ALT: 15 U/L (ref 0–44)
AST: 18 U/L (ref 15–41)
Albumin: 4.3 g/dL (ref 3.5–5.0)
Alkaline Phosphatase: 104 U/L (ref 38–126)
Anion gap: 7 (ref 5–15)
BUN: 13 mg/dL (ref 6–20)
CO2: 26 mmol/L (ref 22–32)
Calcium: 9.1 mg/dL (ref 8.9–10.3)
Chloride: 106 mmol/L (ref 98–111)
Creatinine: 1.15 mg/dL — ABNORMAL HIGH (ref 0.44–1.00)
GFR, Estimated: 55 mL/min — ABNORMAL LOW (ref >60)
Glucose, Bld: 137 mg/dL — ABNORMAL HIGH (ref 70–99)
Potassium: 3.9 mmol/L (ref 3.5–5.1)
Sodium: 139 mmol/L (ref 135–145)
Total Bilirubin: 0.7 mg/dL (ref 0.3–1.2)
Total Protein: 6.7 g/dL (ref 6.5–8.1)

## 2022-06-28 LAB — GENETIC SCREENING ORDER

## 2022-06-28 NOTE — Research (Signed)
[employer] 2021-05 - Specimen Collection Study to Evaluate Biomarkers in Subjects with Cancer   INTRO STUDY/CONSENT  Patient Janice Wagner was identified by Clinical Research as a potential candidate for the above listed study.  This Clinical Research Nurse met with DENECE SHEARER, [id number], on 06/28/22 in a manner and location that ensures patient privacy to discuss participation in the above listed research study.  Patient is Accompanied by her husband and daughter .  A copy of the informed consent document with embedded HIPAA language was provided to the patient.  Patient reads, speaks, and understands [place].   Patient was provided with the business card of this Nurse and encouraged to contact the research team with any questions.  Approximately 20 minutes were spent with the patient reviewing the informed consent documents.  Patient was provided the option of taking informed consent documents home to review and was encouraged to review at their convenience with their support network, including other care providers. Patient took the consent documents home to review.  Will f/u with pt in next few days to determine interest.  [name] 'Donell Sievert, RN, BSN Clinical Research Nurse I 06/28/22 3:48 PM

## 2022-06-28 NOTE — Progress Notes (Signed)
REFERRING PROVIDER: Malachy Mood, MD [address] [place],  [place] [address]  PRIMARY PROVIDER:  Gordan Payment., MD  PRIMARY REASON FOR VISIT:  1. Malignant neoplasm of upper-outer quadrant of right breast in female, estrogen receptor positive (HCC)   2. Family history of breast cancer   3. Family history of colon cancer      HISTORY OF PRESENT ILLNESS:   Janice Wagner, a 61 y.o. female, was seen for a [facility] cancer genetics consultation during the breast multidisciplinary clinic at the request of Dr. Mosetta Putt due to a personal and family history of breast cancer.  Janice Wagner presents to clinic today to discuss the possibility of a hereditary predisposition to cancer, to discuss genetic testing, and to further clarify her future cancer risks, as well as potential cancer risks for family members.   In 2023, at the age of 61, Janice Wagner was diagnosed with invasive ductal carcinoma of the right breast (ER+/PR+/HER2-). The treatment plan is pending.   CANCER HISTORY:  Oncology History  Malignant neoplasm of upper-outer quadrant of right breast in female, estrogen receptor positive (HCC)  06/19/2022 Initial Biopsy   Diagnosis 1. Breast, right, needle core biopsy, 12 o'clock, 2cmfn INVASIVE MODERATELY DIFFERENTIATED DUCTAL ADENOCARCINOMA, GRADE 2 (3+2+1) MICROCALCIFICATIONS PRESENT NEGATIVE FOR LYMPHOVASCULAR INVASION TUMOR MEASURES 6 MM IN GREATEST LINEAR EXTENT 2. Breast, right, needle core biopsy, 9:30 o'clock, 6cmfn BENIGN BREAST WITH FIBROCYSTIC CHANGES INCLUDING STROMAL FIBROSIS AND USUAL DUCT HYPERPLASIA PSEUDOANGIOMATOUS STROMAL HYPERPLASIA (PASH) NEGATIVE FOR MICROCALCIFICATIONS NEGATIVE FOR CARCINOMA 3. Breast, right, needle core biopsy, 10 o'clock, 8cmfn COMPATIBLE WITH A BENIGN FIBROADENOMA RARE MICROCALCIFICATIONS PRESENT NEGATIVE FOR MALIGNANCY Diagnosis Note 1. An immunohistochemical stain for E-cadherin is performed with adequate control and is positive within the  tumor supporting a ductal differentiation.  1. PROGNOSTIC INDICATORS Results: The tumor cells are equivocal for Her2 (2+). Her2 by FISH will be performed and the results reported separately. Estrogen Receptor: 100%, POSITIVE, STRONG STAINING INTENSITY Progesterone Receptor: 90%, POSITIVE, STRONG STAINING INTENSITY Proliferation Marker Ki67: 10%  1. FLUORESCENCE IN-SITU HYBRIDIZATION Results: GROUP 5: HER2 **NEGATIVE**   06/26/2022 Initial Diagnosis   Malignant neoplasm of upper-outer quadrant of right breast in female, estrogen receptor positive (HCC)   06/26/2022 Cancer Staging   Staging form: Breast, AJCC 8th Edition - Clinical: Stage IA (cT1c, cN0, cM0, G2, ER+, PR+, HER2-) - Signed by Ronny Bacon, PA-C on 06/26/2022 Stage prefix: Initial diagnosis Method of lymph node assessment: Clinical Histologic grading system: 3 grade system       Past Medical History:  Diagnosis Date   Arthritis    Empty sella syndrome (HCC)    Family history of breast cancer 06/28/2022   Family history of colon cancer 06/28/2022   H/O: vasectomy    husband with vasectomy   Hypertension    Insulin resistance    Ovarian cyst    Reflux     Past Surgical History:  Procedure Laterality Date   ABDOMINAL SURGERY     Laparotomy-Ovarian cyst   CHOLECYSTECTOMY     HERNIA REPAIR     VAGINAL HYSTERECTOMY  11/2002   VAGINAL HYST., POSTERIOR REPAIR     FAMILY HISTORY:  We obtained a detailed, 4-generation family history.  Significant diagnoses are listed below: Family History  Problem Relation Age of Onset   Colon polyps Mother        ~10   Colon cancer Maternal Uncle 70   Skin cancer Maternal Uncle    Breast cancer Maternal Grandmother 33  Hypertension Maternal Grandfather    Breast cancer Other        MGM's sisters x2; dx after 68     Janice Wagner is unaware of previous family history of genetic testing for hereditary cancer risks. There is no reported Ashkenazi Jewish ancestry.  There is no known consanguinity.  GENETIC COUNSELING ASSESSMENT: Janice Wagner is a 61 y.o. female with a personal and family history of cancer which is somewhat suggestive of a hereditary cancer syndrome and predisposition to cancer given the presence of breast cancer in multiple generations of the family. We, therefore, discussed and recommended the following at today's visit.   DISCUSSION: We discussed that 5 - 10% of cancer is hereditary, with most cases of hereditary breast cancer associated with mutations in BRCA1/2.  There are other genes that can be associated with hereditary breast cancer syndromes.  Type of cancer risk and level of risk are gene-specific. We discussed that testing is beneficial for several reasons including knowing how to follow individuals after completing their treatment, identifying whether potential treatment options would be beneficial, and understanding if other family members could be at risk for cancer and allowing them to undergo genetic testing.   We reviewed the characteristics, features and inheritance patterns of hereditary cancer syndromes. We also discussed genetic testing, including the appropriate family members to test, the process of testing, insurance coverage and turn-around-time for results. We discussed the implications of a negative, positive and/or variant of uncertain significant result. In order to get genetic test results in a timely manner so that Janice Wagner can use these genetic test results for surgical decisions, we recommended Janice Wagner pursue genetic testing for the [facility] STAT BRCAPlus Panel. The BRCAplus panel offered by [employer] and includes sequencing and deletion/duplication analysis for the following 8 genes: ATM, BRCA1, BRCA2, CDH1, CHEK2, PALB2, PTEN, and TP53.  Once complete, we recommend Janice Wagner pursue reflex genetic testing to a more comprehensive gene panel.   The CancerNext-Expanded gene panel offered by [facility] and  includes sequencing, rearrangement, and RNA analysis for the following 77 genes: AIP, ALK, APC, ATM, AXIN2, BAP1, BARD1, BLM, BMPR1A, BRCA1, BRCA2, BRIP1, CDC73, CDH1, CDK4, CDKN1B, CDKN2A, CHEK2, CTNNA1, DICER1, FANCC, FH, FLCN, GALNT12, KIF1B, LZTR1, MAX, MEN1, MET, MLH1, MSH2, MSH3, MSH6, MUTYH, NBN, NF1, NF2, NTHL1, PALB2, PHOX2B, PMS2, POT1, PRKAR1A, PTCH1, PTEN, RAD51C, RAD51D, RB1, RECQL, RET, SDHA, SDHAF2, SDHB, SDHC, SDHD, SMAD4, SMARCA4, SMARCB1, SMARCE1, STK11, SUFU, TMEM127, TP53, TSC1, TSC2, VHL and XRCC2 (sequencing and deletion/duplication); EGFR, EGLN1, HOXB13, KIT, MITF, PDGFRA, POLD1, and POLE (sequencing only); EPCAM and GREM1 (deletion/duplication only).   Based on Janice Wagner's personal and family history of breast cancer, she meets medical criteria for genetic testing. Despite that she meets criteria, she may still have an out of pocket cost. We discussed that if her out of pocket cost for testing is over $100, the laboratory should contact them to discuss self-pay prices, patient pay assistance programs, if applicable, and other billing options.   PLAN: After considering the risks, benefits, and limitations, Janice Wagner provided informed consent to pursue genetic testing and the blood sample was sent to [facility] for analysis of the BRCAPlus and CancerNext-Expanded +RNAinsight Panel. Results should be available within approximately 1-2 weeks' time, at which point they will be disclosed by telephone to Janice Wagner, as will any additional recommendations warranted by these results. Janice Wagner will receive a summary of her genetic counseling visit and a copy of her results once available. This information  will also be available in Epic.   Janice Wagner questions were answered to her satisfaction today. Our contact information was provided should additional questions or concerns arise. Thank you for the referral and allowing [place] to share in the care of your patient.   Breon Diss M. Joette Catching,  [place], [facility] Genetic Counselor [email or web address] ([phone]  The patient was seen for a total of 20 minutes in face-to-face  genetic counseling.   Patient was accompanied by her daughter and husband.  Dr Burr Medico was available to discuss this case as needed.  _______________________________________________________________________ For Office Staff:  Number of people involved in session: 3 Was an Intern/ student involved with case: no

## 2022-06-28 NOTE — Therapy (Signed)
OUTPATIENT PHYSICAL THERAPY BREAST CANCER BASELINE EVALUATION   Patient Name: Janice Wagner MRN: [id number] DOB:[birth year], 61 y.o., female Today's Date: 06/28/2022   PT End of Session - 06/28/22 1241     Visit Number 1    Number of Visits 2    Date for PT Re-Evaluation 08/23/22    PT Start Time 1410    PT Stop Time 1437    PT Time Calculation (min) 27 min    Activity Tolerance Patient tolerated treatment well    Behavior During Therapy WFL for tasks assessed/performed             Past Medical History:  Diagnosis Date   Arthritis    Empty sella syndrome ([place])    H/O: vasectomy    husband with vasectomy   Hypertension    Insulin resistance    Ovarian cyst    Reflux    Past Surgical History:  Procedure Laterality Date   ABDOMINAL SURGERY     Laparotomy-Ovarian cyst   CHOLECYSTECTOMY     HERNIA REPAIR     VAGINAL HYSTERECTOMY  11/2002   VAGINAL HYST., POSTERIOR REPAIR   Patient Active Problem List   Diagnosis Date Noted   Malignant neoplasm of upper-outer quadrant of right breast in female, estrogen receptor positive ([place]) 06/26/2022   Ovarian cyst    Reflux    Arthritis    Insulin resistance    Empty sella syndrome ([place])    H/O: vasectomy     REFERRING PROVIDER: Dr. Coralie Keens  REFERRING DIAG: Right breast cancer  THERAPY DIAG:  Malignant neoplasm of upper-outer quadrant of right breast in female, estrogen receptor positive ([place])  Abnormal posture  Rationale for Evaluation and Treatment Rehabilitation  ONSET DATE: 06/07/2022  SUBJECTIVE                                                                                                                                                                                           SUBJECTIVE STATEMENT: Patient reports she is here today to be seen by her medical team for her newly diagnosed right breast cancer.   PERTINENT HISTORY:  Patient was diagnosed on 06/07/2022 with right grade 2 invasive ductal  carcinoma breast cancer. It measures 1.8 cm and is located in the upper outer quadrant. It is ER/PR positive and HER negative with a Ki67 of 10%.   PATIENT GOALS   reduce lymphedema risk and learn post op HEP.   PAIN:  Are you having pain? No   PRECAUTIONS: Active CA   HAND DOMINANCE: right  WEIGHT BEARING RESTRICTIONS No  FALLS:  Has patient fallen in last 6  months? No  LIVING ENVIRONMENT: Patient lives with: her husband Lives in: House/apartment Has following equipment at home: None  OCCUPATION: Retired  LEISURE: She does not exercise  PRIOR LEVEL OF FUNCTION: Independent   OBJECTIVE  COGNITION:  Overall cognitive status: Within functional limits for tasks assessed    POSTURE:  Forward head and rounded shoulders posture  UPPER EXTREMITY AROM/PROM:  A/PROM RIGHT   eval   Shoulder extension 49  Shoulder flexion 162  Shoulder abduction 167  Shoulder internal rotation 63  Shoulder external rotation 89    (Blank rows = not tested)  A/PROM LEFT   eval  Shoulder extension 47  Shoulder flexion 155  Shoulder abduction 160  Shoulder internal rotation 59  Shoulder external rotation 90    (Blank rows = not tested)   CERVICAL AROM: All within normal limits  UPPER EXTREMITY STRENGTH: WFL   LYMPHEDEMA ASSESSMENTS:   LANDMARK RIGHT   eval  10 cm proximal to olecranon process 32.7  Olecranon process 25.7  10 cm proximal to ulnar styloid process 24.3  Just proximal to ulnar styloid process 16.2  Across hand at thumb web space 17.9  At base of 2nd digit 6.4  (Blank rows = not tested)  LANDMARK LEFT   eval  10 cm proximal to olecranon process 32.2  Olecranon process 25.7  10 cm proximal to ulnar styloid process 22.7  Just proximal to ulnar styloid process 15.6  Across hand at thumb web space 17.7  At base of 2nd digit 6.1  (Blank rows = not tested)   L-DEX LYMPHEDEMA SCREENING:  The patient was assessed using the L-Dex machine today to produce a  lymphedema index baseline score. The patient will be reassessed on a regular basis (typically every 3 months) to obtain new L-Dex scores. If the score is > 6.5 points away from his/her baseline score indicating onset of subclinical lymphedema, it will be recommended to wear a compression garment for 4 weeks, 12 hours per day and then be reassessed. If the score continues to be > 6.5 points from baseline at reassessment, we will initiate lymphedema treatment. Assessing in this manner has a 95% rate of preventing clinically significant lymphedema.   L-DEX FLOWSHEETS - 06/28/22 1200       L-DEX LYMPHEDEMA SCREENING   Measurement Type Unilateral    L-DEX MEASUREMENT EXTREMITY Upper Extremity    POSITION  Standing    DOMINANT SIDE Right    At Risk Side Right    BASELINE SCORE (UNILATERAL) 5.1              QUICK DASH SURVEY:  Katina Dung - 06/28/22 0001     Open a tight or new jar Mild difficulty    Do heavy household chores (wash walls, wash floors) No difficulty    Carry a shopping bag or briefcase No difficulty    Wash your back No difficulty    Use a knife to cut food No difficulty    Recreational activities in which you take some force or impact through your arm, shoulder, or hand (golf, hammering, tennis) No difficulty    During the past week, to what extent has your arm, shoulder or hand problem interfered with your normal social activities with family, friends, neighbors, or groups? Not at all    During the past week, to what extent has your arm, shoulder or hand problem limited your work or other regular daily activities Not at all    Arm, shoulder, or hand pain.  Mild    Tingling (pins and needles) in your arm, shoulder, or hand Mild    Difficulty Sleeping Mild difficulty    DASH Score 9.09 %              PATIENT EDUCATION:  Education details: Lymphedema risk reduction and post op shoulder/posture HEP Person educated: Patient Education method: Explanation,  Demonstration, Handout Education comprehension: Patient verbalized understanding and returned demonstration   HOME EXERCISE PROGRAM: Patient was instructed today in a home exercise program today for post op shoulder range of motion. These included active assist shoulder flexion in sitting, scapular retraction, wall walking with shoulder abduction, and hands behind head external rotation.  She was encouraged to do these twice a day, holding 3 seconds and repeating 5 times when permitted by her physician.   ASSESSMENT:  CLINICAL IMPRESSION: Patient was diagnosed on 06/07/2022 with right grade 2 invasive ductal carcinoma breast cancer. It measures 1.8 cm and is located in the upper outer quadrant. It is ER/PR positive and HER negative with a Ki67 of 10%. Her multidisciplinary medical team met prior to her assessments to determine a recommended treatment plan. She is planning to have a right lumpectomy and sentinel node biopsy followed by Oncotype testing, radiation, and anti-estrogen therapy. She will benefit from a post op PT reassessment to determine needs and from L-Dex screens every 3 months for 2 years to detect subclinical lymphedema.  Pt will benefit from skilled therapeutic intervention to improve on the following deficits: Decreased knowledge of precautions, impaired UE functional use, pain, decreased ROM, postural dysfunction.   PT treatment/interventions: ADL/self-care home management, pt/family education, therapeutic exercise  REHAB POTENTIAL: Excellent  CLINICAL DECISION MAKING: Stable/uncomplicated  EVALUATION COMPLEXITY: Low   GOALS: Goals reviewed with patient? YES  LONG TERM GOALS: (STG=LTG)    Name Target Date Goal status  1 Pt will be able to verbalize understanding of pertinent lymphedema risk reduction practices relevant to her dx specifically related to skin care.  Baseline:  No knowledge 06/28/2022 Achieved at eval  2 Pt will be able to return demo and/or verbalize  understanding of the post op HEP related to regaining shoulder ROM. Baseline:  No knowledge 06/28/2022 Achieved at eval  3 Pt will be able to verbalize understanding of the importance of attending the post op After Breast CA Class for further lymphedema risk reduction education and therapeutic exercise.  Baseline:  No knowledge 06/28/2022 Achieved at eval  4 Pt will demo she has regained full shoulder ROM and function post operatively compared to baselines.  Baseline: See objective measurements taken today. 08/23/2022      PLAN: PT FREQUENCY/DURATION: EVAL and 1 follow up appointment.   PLAN FOR NEXT SESSION: will reassess 3-4 weeks post op to determine needs.   Patient will follow up at outpatient cancer rehab 3-4 weeks following surgery.  If the patient requires physical therapy at that time, a specific plan will be dictated and sent to the referring physician for approval. The patient was educated today on appropriate basic range of motion exercises to begin post operatively and the importance of attending the After Breast Cancer class following surgery.  Patient was educated today on lymphedema risk reduction practices as it pertains to recommendations that will benefit the patient immediately following surgery.  She verbalized good understanding.    Physical Therapy Information for After Breast Cancer Surgery/Treatment:  Lymphedema is a swelling condition that you may be at risk for in your arm if you have lymph nodes removed from  the armpit area.  After a sentinel node biopsy, the risk is approximately 5-9% and is higher after an axillary node dissection.  There is treatment available for this condition and it is not life-threatening.  Contact your physician or physical therapist with concerns. You may begin the 4 shoulder/posture exercises (see additional sheet) when permitted by your physician (typically a week after surgery).  If you have drains, you may need to wait until those are  removed before beginning range of motion exercises.  A general recommendation is to not lift your arms above shoulder height until drains are removed.  These exercises should be done to your tolerance and gently.  This is not a "no pain/no gain" type of recovery so listen to your body and stretch into the range of motion that you can tolerate, stopping if you have pain.  If you are having immediate reconstruction, ask your plastic surgeon about doing exercises as he or she may want you to wait. We encourage you to attend the free one time ABC (After Breast Cancer) class offered by [facility].  You will learn information related to lymphedema risk, prevention and treatment and additional exercises to regain mobility following surgery.  You can call [phone] for more information.  This is offered the 1st and 3rd Monday of each month.  You only attend the class one time. While undergoing any medical procedure or treatment, try to avoid blood pressure being taken or needle sticks from occurring on the arm on the side of cancer.   This recommendation begins after surgery and continues for the rest of your life.  This may help reduce your risk of getting lymphedema (swelling in your arm). An excellent resource for those seeking information on lymphedema is the National Lymphedema Network's web site. It can be accessed at [place].org If you notice swelling in your hand, arm or breast at any time following surgery (even if it is many years from now), please contact your doctor or physical therapist to discuss this.  Lymphedema can be treated at any time but it is easier for you if it is treated early on.  If you feel like your shoulder motion is not returning to normal in a reasonable amount of time, please contact your surgeon or physical therapist.  [facility] [phone]. [address],  [place] [address]  ABC CLASS After Breast  Cancer Class  After Breast Cancer Class is a specially designed exercise class to assist you in a safe recover after having breast cancer surgery.  In this class you will learn how to get back to full function whether your drains were just removed or if you had surgery a month ago.  This one-time class is held the 1st and 3rd Monday of every month from 11:00 a.m. until 12:00 noon virtually.  This class is FREE and space is limited. For more information or to register for the next available class, call [phone].  Class Goals  Understand specific stretches to improve the flexibility of you chest and shoulder. Learn ways to safely strengthen your upper body and improve your posture. Understand the warning signs of infection and why you may be at risk for an arm infection. Learn about Lymphedema and prevention.  ** You do not attend this class until after surgery.  Drains must be removed to participate  Patient was instructed today in a home exercise program today for post op shoulder range of motion. These  included active assist shoulder flexion in sitting, scapular retraction, wall walking with shoulder abduction, and hands behind head external rotation.  She was encouraged to do these twice a day, holding 3 seconds and repeating 5 times when permitted by her physician.  Annia Friendly, [place] 06/28/22 2:51 PM

## 2022-06-28 NOTE — Progress Notes (Signed)
[place]   [phone] Fax:([phone] [place] Note   Patient Care Team: Raina Mina., MD as PCP - General (Internal Medicine) Mauro Kaufmann, RN as Oncology Nurse Navigator Rockwell Germany, RN as Oncology Nurse Navigator Coralie Keens, MD as Consulting Physician (General Surgery) Truitt Merle, MD as Consulting Physician (Hematology) Kyung Rudd, MD as Consulting Physician (Radiation Oncology) Nehemiah Settle, MD (Gastroenterology)  Date of Service:  06/28/2022   CHIEF COMPLAINTS/PURPOSE OF CONSULTATION:  Right Breast Cancer, ER+  REFERRING PHYSICIAN:  [facility]   ASSESSMENT & PLAN:  Janice Wagner is a 61 y.o. post partial hysterectomy female with a history of HTN  1. Malignant neoplasm of upper-outer quadrant of right breast, Stage IA, c(T1c, N0), ER+/PR+/HER2-, Grade 2 -found on screening mammogram. B/l diagnostic MM and right [place] on 06/07/22 showing: 1.8 cm mass at 12 o'clock; 1.7 cm mass at 9-9:30; 0.7 cm mass at 9:30-10. Biopsy on 06/19/22 showed invasive moderately differentiated ductal adenocarcinoma at 12 o'clock. Other two biopsies were benign. --We discussed her imaging findings and the biopsy results in great details. -Given the early stage disease, she is a candidate for lumpectomy and sentinel lymph node biopsy. She is agreeable with that. She was seen by Dr. Ninfa Linden today and likely will proceed with surgery soon.  -I recommend a Oncotype Dx test on the surgical sample and we'll make a decision about adjuvant chemotherapy based on the Oncotype result. Written material of this test was given to her. She is young and fit, would be a good candidate for chemotherapy if her Oncotype recurrence score is high. -Giving the strong ER and PR expression in her postmenopausal status, I recommend adjuvant endocrine therapy with aromatase inhibitor or tamoxifen for a total of 5-10 years to reduce the risk of cancer recurrence. Potential  benefits and side effects were discussed with patient and she is interested. -She was also seen by radiation oncologist Dr. Lisbeth Renshaw today. She will likely benefit from breast radiation if she undergo lumpectomy to decrease the risk of breast cancer.  -We also discussed the breast cancer surveillance after her surgery. She will continue annual screening mammogram, self exam, and a routine office visit with lab and exam with [place]. -I encouraged her to have healthy diet and exercise regularly.    2. Family history -she reports breast cancer in her maternal grandmother and two of her sisters, as well as colon cancer in a maternal uncle. -genetic testing was obtained; she will meet our counselor today.   PLAN:  -proceed with lumpectomy and sentinel lymph node biopsy soon -Oncotype her surgical sample -I will see her after adjuvant radiation, or sooner if needed.   Oncology History  Malignant neoplasm of upper-outer quadrant of right breast in female, estrogen receptor positive ([place])  06/19/2022 Initial Biopsy   Diagnosis 1. Breast, right, needle core biopsy, 12 o'clock, 2cmfn INVASIVE MODERATELY DIFFERENTIATED DUCTAL ADENOCARCINOMA, GRADE 2 (3+2+1) MICROCALCIFICATIONS PRESENT NEGATIVE FOR LYMPHOVASCULAR INVASION TUMOR MEASURES 6 MM IN GREATEST LINEAR EXTENT 2. Breast, right, needle core biopsy, 9:30 o'clock, 6cmfn BENIGN BREAST WITH FIBROCYSTIC CHANGES INCLUDING STROMAL FIBROSIS AND USUAL DUCT HYPERPLASIA PSEUDOANGIOMATOUS STROMAL HYPERPLASIA (PASH) NEGATIVE FOR MICROCALCIFICATIONS NEGATIVE FOR CARCINOMA 3. Breast, right, needle core biopsy, 10 o'clock, 8cmfn COMPATIBLE WITH A BENIGN FIBROADENOMA RARE MICROCALCIFICATIONS PRESENT NEGATIVE FOR MALIGNANCY Diagnosis Note 1. An immunohistochemical stain for E-cadherin is performed with adequate control and is positive within the tumor supporting a ductal differentiation.  1. PROGNOSTIC INDICATORS Results: The tumor  cells are equivocal for  Her2 (2+). Her2 by FISH will be performed and the results reported separately. Estrogen Receptor: 100%, POSITIVE, STRONG STAINING INTENSITY Progesterone Receptor: 90%, POSITIVE, STRONG STAINING INTENSITY Proliferation Marker Ki67: 10%  1. FLUORESCENCE IN-SITU HYBRIDIZATION Results: GROUP 5: HER2 **NEGATIVE**   06/26/2022 Initial Diagnosis   Malignant neoplasm of upper-outer quadrant of right breast in female, estrogen receptor positive ([place])   06/26/2022 Cancer Staging   Staging form: Breast, AJCC 8th Edition - Clinical: Stage IA (cT1c, cN0, cM0, G2, ER+, PR+, HER2-) - Signed by Hayden Pedro, PA-C on 06/26/2022 Stage prefix: Initial diagnosis Method of lymph node assessment: Clinical Histologic grading system: 3 grade system      HISTORY OF PRESENTING ILLNESS:  Janice Wagner 61 y.o. female is a here because of breast cancer. The patient was referred by [facility]. The patient presents to the clinic today accompanied by her daughter and husband.   She had routine screening mammography on 05/30/22 showing a possible abnormality in the bilateral breasts. She endorses keeping up with annual mammograms. She underwent bilateral diagnostic mammography and right breast ultrasonography on 06/07/22 showing: three right breast masses-- 1.8 cm at 12 o'clock, 1.7 cm at 9-9:30, and 0.7 cm at 9:20-10 o'clock.  Biopsy on 06/19/22 showed: invasive moderately differentiated ductal adenocarcinoma to 12 o'clock. Biopsies from 9:30 and 10 were benign. Prognostic indicators significant for: estrogen receptor, 100% positive and progesterone receptor, 90% positive. Proliferation marker Ki67 at 10%. HER2 negative by FISH.   Today the patient notes they felt/feeling prior/after... -she did not palpate the breast mass(es) herself. -denies any pain or other symptoms, aside from discomfort due to recent biopsy.  She has a PMHx of.... -HTN, pre-diabetes -s/p partial hysterectomy in 2004 -arthritis, on  celebrex -stomach polyps  Socially... 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 Her daughter, who is with her today, is a Pharmacist, hospital. -she reports her maternal grandmother and two of her sisters had breast cancer and a maternal uncle with colon cancer -never smoker, only drinks alcohol socially   GYN HISTORY  Menarchal: 61 years old LMP: with hysterectomy in 2004 Contraceptive: took for ~8 years HRT: none GP: 2, first at age 73   REVIEW OF SYSTEMS:    Constitutional: Denies fevers, chills or abnormal night sweats Eyes: Denies blurriness of vision, double vision or watery eyes Ears, nose, mouth, throat, and face: Denies mucositis or sore throat Respiratory: Denies cough, dyspnea or wheezes Cardiovascular: Denies palpitation, chest discomfort or lower extremity swelling Gastrointestinal:  Denies nausea, heartburn or change in bowel habits Skin: Denies abnormal skin rashes Lymphatics: Denies new lymphadenopathy or easy bruising Neurological:Denies numbness, tingling or new weaknesses Behavioral/Psych: Mood is stable, no new changes  All other systems were reviewed with the patient and are negative.   MEDICAL HISTORY:  Past Medical History:  Diagnosis Date   Arthritis    Empty sella syndrome ([place])    H/O: vasectomy    husband with vasectomy   Hypertension    Insulin resistance    Ovarian cyst    Reflux     SURGICAL HISTORY: Past Surgical History:  Procedure Laterality Date   ABDOMINAL SURGERY     Laparotomy-Ovarian cyst   CHOLECYSTECTOMY     HERNIA REPAIR     VAGINAL HYSTERECTOMY  11/2002   VAGINAL HYST., POSTERIOR REPAIR    SOCIAL HISTORY: Social History   Socioeconomic History   Marital status: Married    Spouse name: Not  on file   Number of children: 2   Years of education: Not on file   Highest education level: Not on file  Occupational History   Not on file  Tobacco Use   Smoking status: Never   Smokeless  tobacco: Never  Substance and Sexual Activity   Alcohol use: Yes    Comment: rare   Drug use: Never   Sexual activity: Yes    Birth control/protection: Surgical  Other Topics Concern   Not on file  Social History Narrative   Not on file   Social Determinants of Health   Financial Resource Strain: Not on file  Food Insecurity: Not on file  Transportation Needs: Not on file  Physical Activity: Not on file  Stress: Not on file  Social Connections: Not on file  Intimate Partner Violence: Not on file    FAMILY HISTORY: Family History  Problem Relation Age of Onset   Breast cancer Maternal Grandmother        Age 2   Hypertension Maternal Grandfather    Cancer Maternal Uncle        Colon cancer   Breast cancer Other    Breast cancer Other     ALLERGIES:  is allergic to penicillins.  MEDICATIONS:  Current Outpatient Medications  Medication Sig Dispense Refill   amLODipine (NORVASC) 5 MG tablet Take 5 mg by mouth daily.     celecoxib (CELEBREX) 200 MG capsule Take 200 mg by mouth daily.     Cetirizine HCl (ZYRTEC ALLERGY PO) Take by mouth.     DULoxetine (CYMBALTA) 30 MG capsule Take 30 mg by mouth daily.     lisinopril (ZESTRIL) 20 MG tablet Take 20 mg by mouth daily.     MECLIZINE HCL PO Take by mouth.     metFORMIN (GLUCOPHAGE-XR) 500 MG 24 hr tablet Take 500 mg by mouth 2 (two) times daily.     omeprazole (PRILOSEC) 20 MG capsule Take 20 mg by mouth 2 (two) times daily.     topiramate (TOPAMAX) 25 MG tablet Take 25 mg by mouth daily.     No current facility-administered medications for this visit.    PHYSICAL EXAMINATION: ECOG PERFORMANCE STATUS: 0 - Asymptomatic  Vitals:   06/28/22 1228  BP: 139/77  Pulse: 85  Temp: 97.8 F (36.6 C)  SpO2: 99%   Filed Weights   06/28/22 1228  Weight: 215 lb 8 oz (97.8 kg)    GENERAL:alert, no distress and comfortable SKIN: skin color, texture, turgor are normal, no rashes or significant lesions EYES: normal,  Conjunctiva are pink and non-injected, sclera clear  NECK: supple, thyroid normal size, non-tender, without nodularity LYMPH:  no palpable lymphadenopathy in the cervical, axillary  LUNGS: clear to auscultation and percussion with normal breathing effort HEART: regular rate & rhythm and no murmurs and no lower extremity edema ABDOMEN: soft, (+) mild tenderness Musculoskeletal:no cyanosis of digits and no clubbing  NEURO: alert & oriented x 3 with fluent speech, no focal motor/sensory deficits BREAST: No palpable mass, nodules or adenopathy bilaterally. Breast exam benign.  LABORATORY DATA:  I have reviewed the data as listed    Latest Ref Rng & Units 06/28/2022   11:52 AM  CBC  WBC 4.0 - 10.5 K/uL 9.7   Hemoglobin 12.0 - 15.0 g/dL 12.6   Hematocrit 36.0 - 46.0 % 39.6   Platelets 150 - 400 K/uL 386        Latest Ref Rng & Units 06/28/2022   11:52 AM  CMP  Glucose 70 - 99 mg/dL 137   BUN 6 - 20 mg/dL 13   Creatinine 0.44 - 1.00 mg/dL 1.15   Sodium 135 - 145 mmol/L 139   Potassium 3.5 - 5.1 mmol/L 3.9   Chloride 98 - 111 mmol/L 106   CO2 22 - 32 mmol/L 26   Calcium 8.9 - 10.3 mg/dL 9.1   Total Protein 6.5 - 8.1 g/dL 6.7   Total Bilirubin 0.3 - 1.2 mg/dL 0.7   Alkaline Phos 38 - 126 U/L 104   AST 15 - 41 U/L 18   ALT 0 - 44 U/L 15      RADIOGRAPHIC STUDIES: I have personally reviewed the radiological images as listed and agreed with the findings in the report. No results found.   No orders of the defined types were placed in this encounter.   All questions were answered. The patient knows to call the clinic with any problems, questions or concerns. The total time spent in the appointment was 50 minutes.     Truitt Merle, MD 06/28/2022   I, Wilburn Mylar, am acting as scribe for Truitt Merle, MD.   I have reviewed the above documentation for accuracy and completeness, and I agree with the above.

## 2022-06-29 ENCOUNTER — Telehealth: Payer: Self-pay | Admitting: *Deleted

## 2022-06-29 ENCOUNTER — Encounter: Payer: Self-pay | Admitting: *Deleted

## 2022-06-29 NOTE — Telephone Encounter (Signed)
Left vm regarding [facility] from 06/28/22. Contact information provided for questions or needs. 

## 2022-07-03 ENCOUNTER — Telehealth: Payer: Self-pay | Admitting: Emergency Medicine

## 2022-07-03 NOTE — Telephone Encounter (Signed)
[employer] 2021-05 - Specimen Collection Study to Evaluate Biomarkers in Subjects with Cancer   Called patient to f/u on interest in study.  Pt is interested in study.  Appt made for consent/labs on 07/07/22 at 0930.  Pt to f/u before then with any questions/concerns.  [name] 'Learta CoddingNeysa Bonito, RN, BSN Clinical Research Nurse I 07/03/22 11:58 AM

## 2022-07-04 ENCOUNTER — Telehealth: Payer: Self-pay | Admitting: Genetic Counselor

## 2022-07-04 ENCOUNTER — Encounter: Payer: Self-pay | Admitting: Genetic Counselor

## 2022-07-04 DIAGNOSIS — Z1379 Encounter for other screening for genetic and chromosomal anomalies: Secondary | ICD-10-CM | POA: Insufficient documentation

## 2022-07-04 NOTE — Telephone Encounter (Signed)
Revealed negative BRCAPlus.  Results of pan-cancer panel are pending.

## 2022-07-05 ENCOUNTER — Encounter (HOSPITAL_BASED_OUTPATIENT_CLINIC_OR_DEPARTMENT_OTHER): Payer: Self-pay | Admitting: Surgery

## 2022-07-05 ENCOUNTER — Other Ambulatory Visit: Payer: Self-pay

## 2022-07-05 NOTE — Progress Notes (Signed)
   07/05/22 1545  Pre-op Phone Call  Surgery Date Verified 07/14/22  Arrival Time Verified 0600  Surgery Location Verified [facility] [place]  Medical History Reviewed Yes  Does the patient have diabetes? Pre-diabetes  Do you have a history of heart problems? No (primary MD treats HTN, no cardiologist)  Antiarrhythmic device type  (NA)  Patient educated on enhanced recovery. Yes  Patient educated about smoking cessation 24 hours prior to surgery. N/A Non-Smoker  Patient verbalizes understanding of bowel prep? N/A  Med Rec Completed Yes  Take the Following Meds the Morning of Surgery pepcid with sip water, hold nsaid/herb/vit x5d  Recent  Lab Work, EKG, CXR? Yes  NPO (Including gum & candy) After midnight  Patient instructed to stop clear liquids including Carb loading drink at: 0400 (G2)  Did patient view EMMI videos? No  Responsible adult to drive and be with you for 24 hours? No  Name & Phone Number for Ride/Caregiver husband, Shanon Brow  No Jewelry, money, nail polish or make-up.  No lotions, powders, perfumes. No shaving  48 hrs. prior to surgery. Yes  Contacts, Dentures & Glasses Will Have to be Removed Before OR. Yes  Please bring your ID and Insurance Card the morning of your surgery. (Surgery Centers Only) Yes  Bring any papers or x-rays with you that your surgeon gave you. Yes  Instructed to contact the location of procedure/ provider if they or anyone in their household develops symptoms or tests positive for COVID-19, has close contact with someone who tests positive for COVID, or has known exposure to any contagious illness. Yes  Call this number the morning of surgery  with any problems that may cancel your surgery. [phone]  Covid-19 Assessment  Have you had a positive COVID-19 test within the previous 90 days? No  COVID Testing Guidance Proceed with the additional questions.  Patient's surgery required a COVID-19 test (cardiothoracic, complex ENT, and bronchoscopies/EBUS) No

## 2022-07-07 ENCOUNTER — Other Ambulatory Visit: Payer: Self-pay | Admitting: Emergency Medicine

## 2022-07-07 ENCOUNTER — Other Ambulatory Visit: Payer: Self-pay

## 2022-07-07 ENCOUNTER — Inpatient Hospital Stay: Payer: BC Managed Care – PPO | Admitting: Emergency Medicine

## 2022-07-07 ENCOUNTER — Encounter: Payer: Self-pay | Admitting: Emergency Medicine

## 2022-07-07 ENCOUNTER — Inpatient Hospital Stay: Payer: BC Managed Care – PPO

## 2022-07-07 DIAGNOSIS — Z170 Estrogen receptor positive status [ER+]: Secondary | ICD-10-CM

## 2022-07-07 DIAGNOSIS — C50411 Malignant neoplasm of upper-outer quadrant of right female breast: Secondary | ICD-10-CM

## 2022-07-07 LAB — RESEARCH LABS

## 2022-07-07 NOTE — Progress Notes (Signed)
[place] Psychosocial Distress Screening Spiritual Care  Counselor called patient to check in. The patient did not answer the phone and the counselor left a message. The patient has the counselor's phone number if needs arise.   The patient scored a 4 on the Psychosocial Distress Thermometer which indicates moderate distress. Also assessed for distress and other psychosocial needs.      07/07/2022    1:13 PM  ONCBCN DISTRESS SCREENING  Distress experienced in past week (1-10) 4  Emotional problem type Depression;Adjusting to illness  Referral to support programs Yes    Follow up needed: No.  Lysle Morales,  Counseling Intern  [phone]

## 2022-07-07 NOTE — Research (Signed)
[employer] 2021-05 - Specimen Collection Study to Evaluate Biomarkers in Subjects with Cancer   This Nurse has reviewed this patient's inclusion and exclusion criteria as a second review and confirms Janice Wagner is eligible for study participation. Patient may continue with enrollment.  Vickii Penna, RN, BSN, CPN Clinical Research Nurse I [phone]  07/07/2022 9:40 AM

## 2022-07-07 NOTE — Research (Signed)
[employer] 2021-05 - Specimen Collection Study to Evaluate Biomarkers in Subjects with Cancer   ELIGIBILITY CHECK  Eligibility: Eligibility criteria reviewed with patient. This Nurse has reviewed this patient's inclusion and exclusion criteria and confirmed patient is eligible for study participation. Eligibility confirmed by treating investigator, who also agrees that patient should proceed with enrollment. Patient will continue with enrollment.  Patient confirmed the following verbally today: - They are willing/able to comply with all study procedures. - They have not been/are not currently enrolled in another [employer] example collection study or another cohort within this study   Reviewed exclusion criteria for 'all subjects' as well as 'treatment naive cohort' with patient today who verbally denied all.  Confirmed with chart review as well.  Per MD Burr Medico patient has no condition that would interfere with ability to fully participate in enrollment or protocol.  Blood Collection: Research blood obtained by Fresh venipuncture per patient's preference. Patient tolerated well without any adverse events.  Gift Card: $50 gift card given to patient for her participation in this study.   [name] 'Learta CoddingNeysa Bonito, RN, BSN Clinical Research Nurse I 07/07/22 10:12 AM

## 2022-07-07 NOTE — Research (Signed)
[employer] 2021-05 - Specimen Collection Study to Evaluate Biomarkers in Subjects with Cancer   CONSENT  Patient Janice Wagner was identified by Clinical Research as a potential candidate for the above listed study.  This Clinical Research Nurse met with KABRINA CHRISTIANO, [id number] on 07/07/22 in a manner and location that ensures patient privacy to discuss participation in the above listed research study.  Patient is Accompanied by her father .  Patient was previously provided with informed consent documents.  Patient confirmed they have read the informed consent documents.  As outlined in the informed consent form, this Nurse and Samella Parr discussed the purpose of the research study, the investigational nature of the study, study procedures and requirements for study participation, potential risks and benefits of study participation, as well as alternatives to participation.  This study is not blinded or double-blinded. The patient understands participation is voluntary and they may withdraw from study participation at any time.  This study does not involve randomization.  This study does not involve an investigational drug or device. This study does not involve a placebo. Patient understands enrollment is pending full eligibility review.   Confidentiality and how the patient's information will be used as part of study participation were discussed.  Patient was informed there is reimbursement provided for their time and effort spent on trial participation.  The patient is encouraged to discuss research study participation with their insurance provider to determine what costs they may incur as part of study participation, including research related injury.    All questions were answered to patient's satisfaction.  The informed consent with embedded HIPAA language was reviewed page by page.  The patient's mental and emotional status is appropriate to provide informed consent, and the  patient verbalizes an understanding of study participation.  Patient has agreed to participate in the above listed research study and has voluntarily signed the informed consent version date 10/08/20 (revised 10/24/21) with embedded HIPAA language, version date 10/08/20 (revised 10/24/21)  on 07/07/22 at 09:32 AM.  The patient was provided with a copy of the signed informed consent form with embedded HIPAA language for their reference.  No study specific procedures were obtained prior to the signing of the informed consent document.  Approximately 15 minutes were spent with the patient reviewing the informed consent documents.  Patient was not requested to complete a Release of Information form.  [name] 'Learta CoddingNeysa Bonito, RN, BSN Clinical Research Nurse I 07/07/22 9:59 AM

## 2022-07-07 NOTE — Research (Signed)
[employer] 2021-05 - Specimen Collection Study to Evaluate Biomarkers in Subjects with Cancer   PATIENT HISTORY  Medical History:  High Blood Pressure  Yes Coronary Artery Disease No Lupus    No Rheumatoid Arthritis  No Diabetes   No      If yes, which type?      N/A (patient reports being currently pre-diabetic) Lynch Syndrome  No  Is the patient currently taking a magnesium supplement?   Yes If yes, dose and frequency? N/A Indication? N/A Start date? N/A  Does the patient have a personal history of cancer (greater than 5 years ago)?  No If yes, Cancer type and date of diagnosis?   N/A  Has this previous diagnosis been treated? N/A      If so, treatment type? N/A   Start and end dates of last treatment cycle? N/A  Does the patient have a family history of cancer in 1st or 2nd degree relatives? Yes If yes, Relationship(s) and Cancer type(s)? Maternal grandmother (breast), Great aunt (breast), great aunt (breast), maternal uncle (colon), maternal uncle (skin), father (skin)  Does the patient have history of alcohol consumption? Yes   If yes, current or former? Current If former, year stopped? N/A Number of years? 40 Drinks per week? Less than 1 (rarely per patient)  Does the patient have history of cigarette, cigar, pipe, or chewing tobacco use?  No  If yes, current for former? N/A If yes, type (Cigarette, cigar, pipe, and/or chewing tobacco)? N/A   If former, year stopped? N/A Number of years? N/A Packs/number/containers per day? N/A  [name] 'Janice CoddingNeysa Bonito, RN, BSN Clinical Research Nurse I 07/07/22 10:20 AM

## 2022-07-10 ENCOUNTER — Telehealth: Payer: Self-pay | Admitting: Genetic Counselor

## 2022-07-10 NOTE — Research (Signed)
[employer] 2021-05 - Specimen Collection Study to Evaluate Biomarkers in Subjects with Cancer   LATE ADDENDUM  Patient is NOT taking a magnesium supplement as of 07/07/22 (see previous Research RN note from 07/07/22).  [name] 'Learta CoddingNeysa Bonito, RN, BSN Clinical Research Nurse I 07/10/22 3:07 PM

## 2022-07-10 NOTE — Telephone Encounter (Signed)
Contacted patient in attempt to disclose results of genetic testing.  LVM with contact information requesting a call back.  

## 2022-07-13 ENCOUNTER — Ambulatory Visit: Payer: Self-pay | Admitting: Genetic Counselor

## 2022-07-13 DIAGNOSIS — Z803 Family history of malignant neoplasm of breast: Secondary | ICD-10-CM

## 2022-07-13 DIAGNOSIS — C50411 Malignant neoplasm of upper-outer quadrant of right female breast: Secondary | ICD-10-CM

## 2022-07-13 DIAGNOSIS — Z1379 Encounter for other screening for genetic and chromosomal anomalies: Secondary | ICD-10-CM

## 2022-07-13 DIAGNOSIS — Z800 Family history of malignant neoplasm of digestive organs: Secondary | ICD-10-CM

## 2022-07-13 DIAGNOSIS — Z170 Estrogen receptor positive status [ER+]: Secondary | ICD-10-CM

## 2022-07-13 MED ORDER — ENSURE PRE-SURGERY PO LIQD: 296.0000 mL | Freq: Once | ORAL | Status: DC

## 2022-07-13 NOTE — Telephone Encounter (Signed)
Revealed negative pan-cancer panel and VUS in [facility].

## 2022-07-13 NOTE — Anesthesia Preprocedure Evaluation (Addendum)
Anesthesia Evaluation  Patient identified by MRN, date of birth, ID band Patient awake    Reviewed: Allergy & Precautions, NPO status , Patient's Chart, lab work & pertinent test results  Airway Mallampati: II  TM Distance: >3 FB Neck ROM: Full    Dental no notable dental hx. (+) Dental Advisory Given, Teeth Intact   Pulmonary sleep apnea ,    Pulmonary exam normal breath sounds clear to auscultation       Cardiovascular hypertension, Pt. on medications Normal cardiovascular exam Rhythm:Regular Rate:Normal     Neuro/Psych  Headaches,    GI/Hepatic Neg liver ROS, GERD  ,  Endo/Other  negative endocrine ROS  Renal/GU negative Renal ROS     Musculoskeletal  (+) Arthritis ,   Abdominal   Peds  Hematology negative hematology ROS (+)   Anesthesia Other Findings   Reproductive/Obstetrics                            Anesthesia Physical Anesthesia Plan  ASA: 3  Anesthesia Plan: General   Post-op Pain Management: Tylenol PO (pre-op)* and Regional block*   Induction: Intravenous  PONV Risk Score and Plan: 4 or greater and Ondansetron, Dexamethasone, Treatment may vary due to age or medical condition and Midazolam  Airway Management Planned: LMA  Additional Equipment:   Intra-op Plan:   Post-operative Plan: Extubation in OR  Informed Consent: I have reviewed the patients History and Physical, chart, labs and discussed the procedure including the risks, benefits and alternatives for the proposed anesthesia with the patient or authorized representative who has indicated his/her understanding and acceptance.     Dental advisory given  Plan Discussed with: CRNA  Anesthesia Plan Comments:        Anesthesia Quick Evaluation

## 2022-07-13 NOTE — Progress Notes (Signed)

## 2022-07-13 NOTE — H&P (Signed)
REFERRING PHYSICIAN: Truitt Merle, MD  PROVIDER: Beverlee Nims, MD  MRN: [id number] DOB: 10/13/1960  Subjective  Chief Complaint: Breast Cancer   History of Present Illness: Janice Wagner is a 61 y.o. female who is seen  as an office consultation for evaluation of Breast Cancer .  This is a 61 year old female who was found on recent screening mammography to have a mass at the 12 position of the right breast. This measured approximately 2.3 cm on mammogram. Under ultrasound measured at 1.8 cm. Ultrasound the axilla was unremarkable. She underwent a biopsy of the mass showing an invasive ductal carcinoma. It was 100% ER positive, 90% PR positive, HER2 negative, and had a Ki-67 of 10%. She has had no previous problems regarding her breast. She has a family history of breast cancer maternal grandmother. She denies nipple discharge. She is otherwise healthy without complaints. She has had no previous problems with general anesthesia and has no cardiopulmonary issues  Review of Systems: A complete review of systems was obtained from the patient. I have reviewed this information and discussed as appropriate with the patient. See HPI as well for other ROS.  ROS  Medical History: Past Medical History: Diagnosis Date Arthritis Diabetes mellitus without complication (CMS-HCC) GERD (gastroesophageal reflux disease) Hyperlipidemia Hypertension Sleep apnea  There is no problem list on file for this patient.  Past Surgical History: Procedure Laterality Date CATARACT EXTRACTION CHOLECYSTECTOMY HERNIA REPAIR HYSTERECTOMY   Allergies Allergen Reactions Penicillins Hives  Current Outpatient Medications on File Prior to Visit Medication Sig Dispense Refill amLODIPine (NORVASC) 5 MG tablet Take 5 mg by mouth once daily celecoxib (CELEBREX) 200 MG capsule Take by mouth cetirizine (ZYRTEC) 10 MG tablet Take 1 tablet by mouth once daily cholecalciferol (VITAMIN D3) 1000 unit capsule  Take by mouth cyanocobalamin (VITAMIN B12) 1000 MCG tablet Take by mouth DULoxetine (CYMBALTA) 30 MG DR capsule Take by mouth famotidine (PEPCID) 40 MG tablet Take 1 (one) Tablet two times daily as needed for heartburn lisinopriL (ZESTRIL) 20 MG tablet Take 20 mg by mouth once daily metFORMIN (FORTAMET) 500 MG (OSM) 24 hr tablet Take by mouth topiramate (TOPAMAX) 25 MG tablet Take by mouth  No current facility-administered medications on file prior to visit.  Family History Problem Relation Age of Onset Hyperlipidemia (Elevated cholesterol) Mother Skin cancer Father High blood pressure (Hypertension) Father   Social History  Tobacco Use Smoking Status Never Smokeless Tobacco Never   Social History  Socioeconomic History Marital status: Married Tobacco Use Smoking status: Never Smokeless tobacco: Never Vaping Use Vaping Use: Never used Substance and Sexual Activity Alcohol use: Yes Drug use: Never  Objective: There were no vitals filed for this visit. There is no height or weight on file to calculate BMI.  Physical Exam  She appears well on exam.  There are no palpable breast masses in either breast. There is minimal ecchymosis from the right breast biopsy. There is no axillary adenopathy on either side. The nipple areolar complexes are normal  Labs, Imaging and Diagnostic Testing: Reviewed her mammograms, ultrasound, and pathology results  Assessment and Plan:  Diagnoses and all orders for this visit:  Invasive ductal carcinoma of breast, female, right (CMS-HCC)    The have discussed her in our multidisciplinary breast cancer conference. I have discussed the diagnosis with the patient and her family. I then discussed treatment of breast cancer from a surgical standpoint. We discussed breast conservation versus mastectomy. We discussed the long-term results of both. She is currently interested in breast  conservation. We next discussed proceeding with a  radioactive seed guided right breast lumpectomy and sentinel lymph node biopsy. We discussed the reasons to remove lymph nodes to rule out the spread of cancer. I explained the surgical procedure in detail. We discussed the risks of surgery which includes but is not limited to bleeding, infection, injury to nerves and blood vessels, the need for further surgery if margins or lymph nodes are positive, postoperative seroma formation, arm swelling, cardiopulmonary issues, postoperative recovery, etc. They understand and wish to proceed with surgery which will be scheduled.

## 2022-07-14 ENCOUNTER — Encounter (HOSPITAL_BASED_OUTPATIENT_CLINIC_OR_DEPARTMENT_OTHER)
Admission: RE | Disposition: A | Discharge status: Home / Self Care | Payer: Self-pay | Source: Home / Self Care | Attending: Surgery

## 2022-07-14 ENCOUNTER — Ambulatory Visit (HOSPITAL_BASED_OUTPATIENT_CLINIC_OR_DEPARTMENT_OTHER): Payer: BC Managed Care – PPO | Admitting: Anesthesiology

## 2022-07-14 ENCOUNTER — Other Ambulatory Visit: Payer: Self-pay

## 2022-07-14 ENCOUNTER — Encounter (HOSPITAL_BASED_OUTPATIENT_CLINIC_OR_DEPARTMENT_OTHER): Payer: Self-pay | Admitting: Surgery

## 2022-07-14 ENCOUNTER — Ambulatory Visit (HOSPITAL_BASED_OUTPATIENT_CLINIC_OR_DEPARTMENT_OTHER)
Admission: RE | Admit: 2022-07-14 | Discharge: 2022-07-14 | Disposition: A | Discharge status: Home / Self Care | Payer: BC Managed Care – PPO | Attending: Surgery | Admitting: Surgery

## 2022-07-14 DIAGNOSIS — M1990 Unspecified osteoarthritis, unspecified site: Secondary | ICD-10-CM | POA: Diagnosis not present

## 2022-07-14 DIAGNOSIS — C50811 Malignant neoplasm of overlapping sites of right female breast: Secondary | ICD-10-CM | POA: Insufficient documentation

## 2022-07-14 DIAGNOSIS — G4730 Sleep apnea, unspecified: Secondary | ICD-10-CM | POA: Diagnosis not present

## 2022-07-14 DIAGNOSIS — K219 Gastro-esophageal reflux disease without esophagitis: Secondary | ICD-10-CM | POA: Insufficient documentation

## 2022-07-14 DIAGNOSIS — Z170 Estrogen receptor positive status [ER+]: Secondary | ICD-10-CM | POA: Insufficient documentation

## 2022-07-14 DIAGNOSIS — Z803 Family history of malignant neoplasm of breast: Secondary | ICD-10-CM | POA: Insufficient documentation

## 2022-07-14 DIAGNOSIS — I10 Essential (primary) hypertension: Secondary | ICD-10-CM | POA: Insufficient documentation

## 2022-07-14 DIAGNOSIS — R519 Headache, unspecified: Secondary | ICD-10-CM | POA: Diagnosis not present

## 2022-07-14 DIAGNOSIS — E88819 Insulin resistance, unspecified: Secondary | ICD-10-CM

## 2022-07-14 HISTORY — PX: BREAST LUMPECTOMY WITH RADIOACTIVE SEED AND SENTINEL LYMPH NODE BIOPSY: SHX6550

## 2022-07-14 HISTORY — DX: Migraine, unspecified, not intractable, without status migrainosus: G43.909

## 2022-07-14 HISTORY — DX: Gastro-esophageal reflux disease without esophagitis: K21.9

## 2022-07-14 HISTORY — DX: Sleep apnea, unspecified: G47.30

## 2022-07-14 SURGERY — BREAST LUMPECTOMY WITH RADIOACTIVE SEED AND SENTINEL LYMPH NODE BIOPSY
Anesthesia: General | Site: Breast | Laterality: Right

## 2022-07-14 MED ORDER — TRAMADOL HCL 50 MG PO TABS: 50.0000 mg | ORAL_TABLET | Freq: Four times a day (QID) | ORAL | 0 refills | Status: DC | PRN

## 2022-07-14 MED ORDER — FENTANYL CITRATE (PF) 100 MCG/2ML IJ SOLN
100.0000 ug | Freq: Once | INTRAMUSCULAR | Status: AC
  Administered 2022-07-14: 50 ug via INTRAVENOUS

## 2022-07-14 MED ORDER — CLONIDINE HCL (ANALGESIA) 100 MCG/ML EP SOLN
EPIDURAL | Status: DC | PRN
  Administered 2022-07-14: 80 ug

## 2022-07-14 MED ORDER — LIDOCAINE 2% (20 MG/ML) 5 ML SYRINGE
INTRAMUSCULAR | Status: AC
  Filled 2022-07-14: qty 5

## 2022-07-14 MED ORDER — DEXAMETHASONE SODIUM PHOSPHATE 10 MG/ML IJ SOLN
INTRAMUSCULAR | Status: AC
  Filled 2022-07-14: qty 1

## 2022-07-14 MED ORDER — FENTANYL CITRATE (PF) 100 MCG/2ML IJ SOLN
INTRAMUSCULAR | Status: AC
  Filled 2022-07-14: qty 2

## 2022-07-14 MED ORDER — CIPROFLOXACIN IN D5W 400 MG/200ML IV SOLN
400.0000 mg | INTRAVENOUS | Status: AC
  Administered 2022-07-14: 400 mg via INTRAVENOUS

## 2022-07-14 MED ORDER — CHLORHEXIDINE GLUCONATE CLOTH 2 % EX PADS: 6.0000 | MEDICATED_PAD | Freq: Once | CUTANEOUS | Status: DC

## 2022-07-14 MED ORDER — EPHEDRINE SULFATE (PRESSORS) 50 MG/ML IJ SOLN
INTRAMUSCULAR | Status: DC | PRN
  Administered 2022-07-14: 5 mg via INTRAVENOUS

## 2022-07-14 MED ORDER — AMISULPRIDE (ANTIEMETIC) 5 MG/2ML IV SOLN: 10.0000 mg | Freq: Once | INTRAVENOUS | Status: DC | PRN

## 2022-07-14 MED ORDER — ONDANSETRON HCL 4 MG/2ML IJ SOLN
INTRAMUSCULAR | Status: DC | PRN
  Administered 2022-07-14: 4 mg via INTRAVENOUS

## 2022-07-14 MED ORDER — PROPOFOL 10 MG/ML IV BOLUS
INTRAVENOUS | Status: AC
  Filled 2022-07-14: qty 20

## 2022-07-14 MED ORDER — MIDAZOLAM HCL 2 MG/2ML IJ SOLN
INTRAMUSCULAR | Status: AC
  Filled 2022-07-14: qty 2

## 2022-07-14 MED ORDER — ONDANSETRON HCL 4 MG PO TABS: 4.0000 mg | ORAL_TABLET | Freq: Three times a day (TID) | ORAL | 0 refills | Status: AC | PRN

## 2022-07-14 MED ORDER — PROPOFOL 10 MG/ML IV BOLUS
INTRAVENOUS | Status: DC | PRN
  Administered 2022-07-14: 200 mg via INTRAVENOUS

## 2022-07-14 MED ORDER — DEXAMETHASONE SODIUM PHOSPHATE 4 MG/ML IJ SOLN
INTRAMUSCULAR | Status: DC | PRN
  Administered 2022-07-14: 5 mg

## 2022-07-14 MED ORDER — MAGTRACE LYMPHATIC TRACER
INTRAMUSCULAR | Status: DC | PRN
  Administered 2022-07-14: 2 mL via INTRAMUSCULAR

## 2022-07-14 MED ORDER — FENTANYL CITRATE (PF) 100 MCG/2ML IJ SOLN
INTRAMUSCULAR | Status: DC | PRN
  Administered 2022-07-14 (×2): 50 ug via INTRAVENOUS

## 2022-07-14 MED ORDER — ACETAMINOPHEN 500 MG PO TABS
1000.0000 mg | ORAL_TABLET | ORAL | Status: AC
  Administered 2022-07-14: 1000 mg via ORAL

## 2022-07-14 MED ORDER — PROMETHAZINE HCL 25 MG/ML IJ SOLN: 6.2500 mg | INTRAMUSCULAR | Status: DC | PRN

## 2022-07-14 MED ORDER — ACETAMINOPHEN 500 MG PO TABS
ORAL_TABLET | ORAL | Status: AC
  Filled 2022-07-14: qty 2

## 2022-07-14 MED ORDER — MIDAZOLAM HCL 2 MG/2ML IJ SOLN
2.0000 mg | Freq: Once | INTRAMUSCULAR | Status: AC
  Administered 2022-07-14: 2 mg via INTRAVENOUS

## 2022-07-14 MED ORDER — ONDANSETRON HCL 4 MG/2ML IJ SOLN
INTRAMUSCULAR | Status: AC
  Filled 2022-07-14: qty 2

## 2022-07-14 MED ORDER — BUPIVACAINE-EPINEPHRINE 0.5% -1:200000 IJ SOLN
INTRAMUSCULAR | Status: DC | PRN
  Administered 2022-07-14: 20 mL

## 2022-07-14 MED ORDER — DEXAMETHASONE SODIUM PHOSPHATE 4 MG/ML IJ SOLN
INTRAMUSCULAR | Status: DC | PRN
  Administered 2022-07-14: 10 mg via INTRAVENOUS

## 2022-07-14 MED ORDER — LACTATED RINGERS IV SOLN: INTRAVENOUS | Status: DC

## 2022-07-14 MED ORDER — CIPROFLOXACIN IN D5W 400 MG/200ML IV SOLN
INTRAVENOUS | Status: AC
  Filled 2022-07-14: qty 200

## 2022-07-14 MED ORDER — HYDROMORPHONE HCL 1 MG/ML IJ SOLN: 0.2500 mg | INTRAMUSCULAR | Status: DC | PRN

## 2022-07-14 MED ORDER — LIDOCAINE 2% (20 MG/ML) 5 ML SYRINGE
INTRAMUSCULAR | Status: DC | PRN
  Administered 2022-07-14: 80 mg via INTRAVENOUS

## 2022-07-14 MED ORDER — MEPERIDINE HCL 25 MG/ML IJ SOLN: 6.2500 mg | INTRAMUSCULAR | Status: DC | PRN

## 2022-07-14 MED ORDER — DEXMEDETOMIDINE HCL IN NACL 80 MCG/20ML IV SOLN
INTRAVENOUS | Status: AC
  Filled 2022-07-14: qty 20

## 2022-07-14 MED ORDER — BUPIVACAINE-EPINEPHRINE (PF) 0.25% -1:200000 IJ SOLN
INTRAMUSCULAR | Status: AC
  Filled 2022-07-14: qty 30

## 2022-07-14 MED ORDER — ROPIVACAINE HCL 5 MG/ML IJ SOLN
INTRAMUSCULAR | Status: DC | PRN
  Administered 2022-07-14: 30 mL

## 2022-07-14 SURGICAL SUPPLY — 44 items
ADH SKN CLS APL DERMABOND .7 (GAUZE/BANDAGES/DRESSINGS) ×1
APL PRP STRL LF DISP 70% ISPRP (MISCELLANEOUS) ×1
APPLIER CLIP 9.375 MED OPEN (MISCELLANEOUS) ×1
APR CLP MED 9.3 20 MLT OPN (MISCELLANEOUS) ×1
BLADE SURG 15 STRL LF DISP TIS (BLADE) ×1 IMPLANT
BLADE SURG 15 STRL SS (BLADE) ×1
CANISTER SUCT 1200ML W/VALVE (MISCELLANEOUS) IMPLANT
CHLORAPREP W/TINT 26 (MISCELLANEOUS) ×1 IMPLANT
CLIP APPLIE 9.375 MED OPEN (MISCELLANEOUS) ×1 IMPLANT
CLIP TI WIDE RED SMALL 6 (CLIP) IMPLANT
COVER BACK TABLE 60X90IN (DRAPES) ×1 IMPLANT
COVER MAYO STAND STRL (DRAPES) ×1 IMPLANT
COVER PROBE W GEL 5X96 (DRAPES) ×1 IMPLANT
DERMABOND ADVANCED .7 DNX12 (GAUZE/BANDAGES/DRESSINGS) ×1 IMPLANT
DRAPE LAPAROSCOPIC ABDOMINAL (DRAPES) ×1 IMPLANT
DRAPE UTILITY XL STRL (DRAPES) ×1 IMPLANT
ELECT REM PT RETURN 9FT ADLT (ELECTROSURGICAL) ×1
ELECTRODE REM PT RTRN 9FT ADLT (ELECTROSURGICAL) ×1 IMPLANT
GAUZE SPONGE 4X4 12PLY STRL LF (GAUZE/BANDAGES/DRESSINGS) IMPLANT
GLOVE SURG SIGNA 7.5 PF LTX (GLOVE) ×1 IMPLANT
GOWN STRL REUS W/ TWL LRG LVL3 (GOWN DISPOSABLE) ×1 IMPLANT
GOWN STRL REUS W/ TWL XL LVL3 (GOWN DISPOSABLE) ×1 IMPLANT
GOWN STRL REUS W/TWL LRG LVL3 (GOWN DISPOSABLE) ×1
GOWN STRL REUS W/TWL XL LVL3 (GOWN DISPOSABLE) ×1
KIT MARKER MARGIN INK (KITS) ×1 IMPLANT
NDL HYPO 25X1 1.5 SAFETY (NEEDLE) ×1 IMPLANT
NDL SAFETY ECLIP 18X1.5 (MISCELLANEOUS) ×1 IMPLANT
NEEDLE HYPO 25X1 1.5 SAFETY (NEEDLE) ×1 IMPLANT
NS IRRIG 1000ML POUR BTL (IV SOLUTION) ×1 IMPLANT
PACK BASIN DAY SURGERY FS (CUSTOM PROCEDURE TRAY) ×1 IMPLANT
PENCIL SMOKE EVACUATOR (MISCELLANEOUS) ×1 IMPLANT
SLEEVE SCD COMPRESS KNEE MED (STOCKING) ×1 IMPLANT
SPIKE FLUID TRANSFER (MISCELLANEOUS) IMPLANT
SPONGE T-LAP 4X18 ~~LOC~~+RFID (SPONGE) ×1 IMPLANT
SUT MNCRL AB 4-0 PS2 18 (SUTURE) ×1 IMPLANT
SUT SILK 2 0 SH (SUTURE) IMPLANT
SUT VIC AB 3-0 SH 27 (SUTURE) ×1
SUT VIC AB 3-0 SH 27X BRD (SUTURE) ×1 IMPLANT
SYR CONTROL 10ML LL (SYRINGE) ×1 IMPLANT
TOWEL GREEN STERILE FF (TOWEL DISPOSABLE) ×1 IMPLANT
TRACER MAGTRACE VIAL (MISCELLANEOUS) IMPLANT
TRAY FAXITRON CT DISP (TRAY / TRAY PROCEDURE) ×1 IMPLANT
TUBE CONNECTING 20X1/4 (TUBING) IMPLANT
YANKAUER SUCT BULB TIP NO VENT (SUCTIONS) IMPLANT

## 2022-07-14 NOTE — Transfer of Care (Signed)
Immediate Anesthesia Transfer of Care Note  Patient: LOIS SLAGEL  Procedure(s) Performed: RIGHT BREAST LUMPECTOMY WITH RADIOACTIVE SEED AND SENTINEL LYMPH NODE BIOPSY (Right: Breast)  Patient Location: PACU  Anesthesia Type:General and Regional  Level of Consciousness: drowsy  Airway & Oxygen Therapy: Patient Spontanous Breathing and Patient connected to face mask oxygen  Post-op Assessment: Report given to RN and Post -op Vital signs reviewed and stable  Post vital signs: Reviewed and stable  Last Vitals:  Vitals Value Taken Time  BP 111/64 07/14/22 0841  Temp    Pulse 63 07/14/22 0843  Resp 13 07/14/22 0843  SpO2 94 % 07/14/22 0843  Vitals shown include unvalidated device data.  Last Pain:  Vitals:   07/14/22 0634  TempSrc: Oral  PainSc: 0-No pain      Patients Stated Pain Goal: 3 (40/08/67 6195)  Complications: No notable events documented.

## 2022-07-14 NOTE — Interval H&P Note (Signed)
History and Physical Interval Note:no change in H and P  07/14/2022 6:45 AM  Janice Wagner  has presented today for surgery, with the diagnosis of RIGHT BREAST CANCER.  The various methods of treatment have been discussed with the patient and family. After consideration of risks, benefits and other options for treatment, the patient has consented to  Procedure(s): RIGHT BREAST LUMPECTOMY WITH RADIOACTIVE SEED AND SENTINEL LYMPH NODE BIOPSY (Right) as a surgical intervention.  The patient's history has been reviewed, patient examined, no change in status, stable for surgery.  I have reviewed the patient's chart and labs.  Questions were answered to the patient's satisfaction.     Coralie Keens

## 2022-07-14 NOTE — Discharge Instructions (Addendum)
[place] Office Phone Number [phone]  BREAST BIOPSY/ PARTIAL MASTECTOMY: POST OP INSTRUCTIONS  Always review your discharge instruction sheet given to you by the facility where your surgery was performed.  IF YOU HAVE DISABILITY OR FAMILY LEAVE FORMS, YOU MUST BRING THEM TO THE OFFICE FOR PROCESSING.  DO NOT GIVE THEM TO YOUR DOCTOR.  A prescription for pain medication may be given to you upon discharge.  Take your pain medication as prescribed, if needed.  If narcotic pain medicine is not needed, then you may take acetaminophen (Tylenol) or ibuprofen (Advil) as needed. Take your usually prescribed medications unless otherwise directed If you need a refill on your pain medication, please contact your pharmacy.  They will contact our office to request authorization.  Prescriptions will not be filled after 5pm or on week-ends. You should eat very light the first 24 hours after surgery, such as soup, crackers, pudding, etc.  Resume your normal diet the day after surgery. Most patients will experience some swelling and bruising in the breast.  Ice packs and a good support bra will help.  Swelling and bruising can take several days to resolve.  It is common to experience some constipation if taking pain medication after surgery.  Increasing fluid intake and taking a stool softener will usually help or prevent this problem from occurring.  A mild laxative (Milk of Magnesia or Miralax) should be taken according to package directions if there are no bowel movements after 48 hours. Unless discharge instructions indicate otherwise, you may remove your bandages 24-48 hours after surgery, and you may shower at that time.  You may have steri-strips (small skin tapes) in place directly over the incision.  These strips should be left on the skin for 7-10 days.  If your surgeon used skin glue on the incision, you may shower in 24 hours.  The glue will flake off over the next 2-3 weeks.  Any  sutures or staples will be removed at the office during your follow-up visit. ACTIVITIES:  You may resume regular daily activities (gradually increasing) beginning the next day.  Wearing a good support bra or sports bra minimizes pain and swelling.  You may have sexual intercourse when it is comfortable. You may drive when you no longer are taking prescription pain medication, you can comfortably wear a seatbelt, and you can safely maneuver your car and apply brakes. RETURN TO WORK:  ______________________________________________________________________________________ Dennis Bast should see your doctor in the office for a follow-up appointment approximately two weeks after your surgery.  Your doctor's nurse will typically make your follow-up appointment when she calls you with your pathology report.  Expect your pathology report 2-3 business days after your surgery.  You may call to check if you do not hear from [place] after three days. OTHER INSTRUCTIONS: YOU MAY REMOVE THE BINDER AND SHOWER STARTING TOMORROW.  TRY AND WEAR THE BINDER THE NEXT 5 DAYS IF POSSIBLE ICE PACK, TYLENOL, AND IBUPROFEN ALSO FOR PAIN NO VIGOROUS ACTIVITY FOR ONE WEEK _______________________________________________________________________________________________ _____________________________________________________________________________________________________________________________________ _____________________________________________________________________________________________________________________________________ _____________________________________________________________________________________________________________________________________  WHEN TO CALL YOUR DOCTOR: Fever over 101.0 Nausea and/or vomiting. Extreme swelling or bruising. Continued bleeding from incision. Increased pain, redness, or drainage from the incision.  The clinic staff is available to answer your questions during regular business hours.  Please  don't hesitate to call and ask to speak to one of the nurses for clinical concerns.  If you have a medical emergency, go to the nearest emergency room or call 911.  A surgeon from  [facility] is always on call at the hospital.  For further questions, please visit [email or web address]     Post Anesthesia Home Care Instructions  Activity: Get plenty of rest for the remainder of the day. A responsible individual must stay with you for 24 hours following the procedure.  For the next 24 hours, DO NOT: -Drive a car -Paediatric nurse -Drink alcoholic beverages -Take any medication unless instructed by your physician -Make any legal decisions or sign important papers.  Meals: Start with liquid foods such as gelatin or soup. Progress to regular foods as tolerated. Avoid greasy, spicy, heavy foods. If nausea and/or vomiting occur, drink only clear liquids until the nausea and/or vomiting subsides. Call your physician if vomiting continues.  Special Instructions/Symptoms: Your throat may feel dry or sore from the anesthesia or the breathing tube placed in your throat during surgery. If this causes discomfort, gargle with warm salt water. The discomfort should disappear within 24 hours.  If you had a scopolamine patch placed behind your ear for the management of post- operative nausea and/or vomiting:  1. The medication in the patch is effective for 72 hours, after which it should be removed.  Wrap patch in a tissue and discard in the trash. Wash hands thoroughly with soap and water. 2. You may remove the patch earlier than 72 hours if you experience unpleasant side effects which may include dry mouth, dizziness or visual disturbances. 3. Avoid touching the patch. Wash your hands with soap and water after contact with the patch.  No tylenol until after 1245pm today if needed.

## 2022-07-14 NOTE — Anesthesia Procedure Notes (Signed)
Anesthesia Regional Block: Pectoralis block   Pre-Anesthetic Checklist: , timeout performed,  Correct Patient, Correct Site, Correct Laterality,  Correct Procedure, Correct Position, site marked,  Risks and benefits discussed,  Surgical consent,  Pre-op evaluation,  At surgeon's request and post-op pain management  Laterality: Right  Prep: chloraprep       Needles:   Needle Type: Stimiplex     Needle Length: 9cm      Additional Needles:   Procedures:,,,, ultrasound used (permanent image in chart),,    Narrative:  Start time: 07/14/2022 7:12 AM End time: 07/14/2022 7:22 AM Injection made incrementally with aspirations every 5 mL.  Performed by: Personally  Anesthesiologist: Nolon Nations, MD  Additional Notes: BP cuff, SpO2 and EKG monitors applied. Sedation begun.  Anesthetic injected incrementally, slowly, and after neg aspirations under direct ultrasound guidance. Good fascial spread noted. Patient tolerated well.

## 2022-07-14 NOTE — Anesthesia Procedure Notes (Signed)
Procedure Name: LMA Insertion Date/Time: 07/14/2022 7:36 AM  Performed by: Ezequiel Kayser, CRNAPre-anesthesia Checklist: Patient identified, Emergency Drugs available, Suction available and Patient being monitored Patient Re-evaluated:Patient Re-evaluated prior to induction Oxygen Delivery Method: Circle System Utilized Preoxygenation: Pre-oxygenation with 100% oxygen Induction Type: IV induction Ventilation: Mask ventilation without difficulty LMA: LMA inserted LMA Size: 4.0 Number of attempts: 1 Airway Equipment and Method: Bite block Placement Confirmation: positive ETCO2 Tube secured with: Tape Dental Injury: Teeth and Oropharynx as per pre-operative assessment

## 2022-07-14 NOTE — Progress Notes (Signed)
Assisted Dr. Lissa Hoard with right, pectoralis, ultrasound guided block. Side rails up, monitors on throughout procedure. See vital signs in flow sheet. Tolerated Procedure well.

## 2022-07-14 NOTE — Anesthesia Postprocedure Evaluation (Signed)
Anesthesia Post Note  Patient: Janice Wagner  Procedure(s) Performed: RIGHT BREAST LUMPECTOMY WITH RADIOACTIVE SEED AND SENTINEL LYMPH NODE BIOPSY (Right: Breast)     Patient location during evaluation: PACU Anesthesia Type: General Level of consciousness: sedated and patient cooperative Pain management: pain level controlled Vital Signs Assessment: post-procedure vital signs reviewed and stable Respiratory status: spontaneous breathing Cardiovascular status: stable Anesthetic complications: no   No notable events documented.  Last Vitals:  Vitals:   07/14/22 0945 07/14/22 1000  BP: (!) 121/55   Pulse: 67 67  Resp: 20 20  Temp: 36.8 C 36.8 C  SpO2:  96%    Last Pain:  Vitals:   07/14/22 1000  TempSrc:   PainSc: 2                  Nolon Nations

## 2022-07-14 NOTE — Op Note (Signed)
RIGHT BREAST LUMPECTOMY WITH RADIOACTIVE SEED AND SENTINEL LYMPH NODE BIOPSY  Procedure Note  Janice Wagner 07/14/2022   Pre-op Diagnosis: RIGHT BREAST CANCER     Post-op Diagnosis: same  Procedure(s): RIGHT BREAST LUMPECTOMY WITH RADIOACTIVE SEED AND DEEP RIGHT AXILLARY SENTINEL LYMPH NODE BIOPSY INJECTION OF MAG TRACE FOR LYMPH NODE MAPPING  Surgeon(s): Coralie Keens, MD  Anesthesia: General  Staff:  Circulator: Maurene Capes, RN; Anson Crofts, RN Scrub Person: Logan Bores  Estimated Blood Loss: Minimal               Specimens: sent to path  Indications: This is a 61 year old female who was found to have a mass in her right breast on screening mammography.  She underwent a biopsy showing invasive ductal carcinoma which was ER and PR positive.  The decision was made to proceed with a right breast radioactive seed guided lumpectomy and sentinel lymph node biopsy  Procedure: The patient was brought to the operating room and identifies the correct patient.  She was placed upon on the operating room table and general anesthesia was induced.  I next injected mag trace underneath the right nipple areolar complex with a 25-gauge needle and massaged the breast.  Her right breast and axilla were then prepped and draped in the usual sterile fashion.  Using the neoprobe I located the radioactive seed at the 12 o'clock position of the right breast approximately 3 cm from the nipple areolar complex.  I anesthetized the edge of the areola with Marcaine and then made a circumareolar incision with a scalpel.  I then dissected into the breast tissue with electrocautery.  I then stayed right underneath the skin and dermis and dissected superiorly toward the radioactive seed.  I reached the level of the seed and then dissected further past it and then down to the chest wall.  With the aid of the neoprobe I then performed a wide lumpectomy going down to the chest wall medial and  lateral and then toward the nipple areolar complex inferiorly.  I then completed the lumpectomy with the aid of the neoprobe staying widely around the seed and the palpable mass.  Again I taken the dissection all the way down to the chest wall.  Once the lumpectomy specimen was removed I marked all margins with paint.  An x-ray was performed on the specimen confirming that the radioactive seed and previous biopsy clip were in the specimen.  The specimen was then sent to pathology for evaluation. Using the mag trace probe identified an area of increased uptake in the right axilla.  I anesthetized the skin with Marcaine and then made an incision with a scalpel.  I then dissected down into the deep axillary tissue with the cautery.  With the aid of the mag trace probe I then identified to slightly enlarged lymph nodes.  I excised the separately with her surrounding fat and sent them to pathology for evaluation.  No other palpable nodes and no other take of the mag trace was identified.  Hemostasis was achieved with the cautery and surgical clips.  I next placed clips around the periphery of the lumpectomy cavity and injected further Marcaine into it as well.  Hemostasis also appeared to be achieved at the lumpectomy cavity.  I then closed the subcutaneous tissue at the lumpectomy site with interrupted 3-0 Vicryl sutures and closed the skin with a running 4 Monocryl.  I then closed the axillary incision with interrupted 3-0 Vicryl sutures and  a running 4 Monocryl as well.  Dermabond was then applied.  The patient tolerated the procedure well.  All the counts were correct at the end of the procedure.  The patient was then extubated in the operating room and taken in a stable condition to the recovery room.          Coralie Keens   Date: 07/14/2022  Time: 8:35 AM

## 2022-07-21 LAB — SURGICAL PATHOLOGY

## 2022-07-24 ENCOUNTER — Encounter (HOSPITAL_BASED_OUTPATIENT_CLINIC_OR_DEPARTMENT_OTHER): Payer: Self-pay | Admitting: Surgery

## 2022-07-24 ENCOUNTER — Encounter: Payer: Self-pay | Admitting: *Deleted

## 2022-07-24 ENCOUNTER — Telehealth: Payer: Self-pay | Admitting: *Deleted

## 2022-07-24 NOTE — Telephone Encounter (Signed)
Ordered oncotype per Dr. Burr Medico. Requisition sent to pathology and exact sciences.

## 2022-08-04 ENCOUNTER — Encounter: Payer: Self-pay | Admitting: *Deleted

## 2022-08-04 ENCOUNTER — Telehealth: Payer: Self-pay | Admitting: *Deleted

## 2022-08-04 DIAGNOSIS — C50411 Malignant neoplasm of upper-outer quadrant of right female breast: Secondary | ICD-10-CM

## 2022-08-04 NOTE — Telephone Encounter (Signed)
Received oncotype results of 14/4%. Patient is aware. Referral placed for Dr. Lisbeth Renshaw.

## 2022-08-07 ENCOUNTER — Encounter (HOSPITAL_COMMUNITY): Payer: Self-pay

## 2022-08-07 NOTE — Therapy (Signed)
OUTPATIENT PHYSICAL THERAPY BREAST CANCER POST OP FOLLOW UP   Patient Name: Janice Wagner MRN: [id number] DOB:May 19, 1961, 61 y.o., female Today's Date: 08/08/2022   PT End of Session - 08/08/22 1203     Visit Number 2    Number of Visits 2    Date for PT Re-Evaluation 08/08/22    PT Start Time 1204    PT Stop Time 1254    PT Time Calculation (min) 50 min    Activity Tolerance Patient tolerated treatment well    Behavior During Therapy [facility] for tasks assessed/performed             Past Medical History:  Diagnosis Date   Arthritis    Empty sella syndrome (HCC)    Family history of breast cancer 06/28/2022   Family history of colon cancer 06/28/2022   GERD (gastroesophageal reflux disease)    H/O: vasectomy    husband with vasectomy   Hypertension    Insulin resistance    Migraines    Ovarian cyst    Reflux    Sleep apnea    has used CPAP, not currently   Past Surgical History:  Procedure Laterality Date   ABDOMINAL SURGERY     Laparotomy-Ovarian cyst   BREAST LUMPECTOMY WITH RADIOACTIVE SEED AND SENTINEL LYMPH NODE BIOPSY Right 07/14/2022   Procedure: RIGHT BREAST LUMPECTOMY WITH RADIOACTIVE SEED AND SENTINEL LYMPH NODE BIOPSY;  Surgeon: Abigail Miyamoto, MD;  Location: [facility];  Service: General;  Laterality: Right;   CHOLECYSTECTOMY     HERNIA REPAIR     VAGINAL HYSTERECTOMY  11/2002   VAGINAL HYST., POSTERIOR REPAIR   Patient Active Problem List   Diagnosis Date Noted   Genetic testing 07/04/2022   Family history of breast cancer 06/28/2022   Family history of colon cancer 06/28/2022   Malignant neoplasm of upper-outer quadrant of right breast in female, estrogen receptor positive (HCC) 06/26/2022   Ovarian cyst    Reflux    Arthritis    Insulin resistance    Empty sella syndrome (HCC)    H/O: vasectomy     PCP: Ezra Sites  REFERRING PROVIDER: Abigail Miyamoto, MD  REFERRING DIAG: Right Breast Cancer  THERAPY DIAG:   Malignant neoplasm of upper-outer quadrant of right breast in female, estrogen receptor positive (HCC)  Abnormal posture  Rationale for Evaluation and Treatment: Rehabilitation  ONSET DATE: 06/07/2022  SUBJECTIVE:                                                                                                                                                                                           SUBJECTIVE  STATEMENT: I h.ave some mild pins and needles near the incision. I have been doing the exercises. When I walk even in the compression bra its a little uncomfortable and sometimes I have to support my breast. It has improved though. Overall things are going well.  PERTINENT HISTORY:  Patient was diagnosed on 06/07/2022 with right grade 2 invasive ductal carcinoma breast cancer. It measures 1.8 cm and is located in the upper outer quadrant. It is ER/PR positive and HER negative with a Ki67 of 10%. She had a right lumpectomy with SLNB on 07/14/2022. Her Oncotype is 14. She will have her radiation simulation today and will have 4 weeks of radiation.  PATIENT GOALS:  Reassess how my recovery is going related to arm function, pain, and swelling.  PAIN:  Are you having pain? No just discomfort  PRECAUTIONS: Recent Surgery, right UE Lymphedema risk,   ACTIVITY LEVEL / LEISURE: usually 3-4 times per week for 30-40 min   OBJECTIVE:   PATIENT SURVEYS:  QUICK DASH: 18%  OBSERVATIONS: Incisions healing; several small scabs and glue present on both incisions. Some swelling noted lateral breast and small area of fibrosis noted proximal to nipple  POSTURE:  Forward head, rounded shoulders  LYMPHEDEMA ASSESSMENT:    UPPER EXTREMITY AROM/PROM:   A/PROM RIGHT   eval   08/08/2022  Shoulder extension 49 64  Shoulder flexion 162 164  Shoulder abduction 167 174  Shoulder internal rotation 63 70  Shoulder external rotation 89 104                          (Blank rows = not tested)    A/PROM LEFT   eval  Shoulder extension 47  Shoulder flexion 155  Shoulder abduction 160  Shoulder internal rotation 59  Shoulder external rotation 90                          (Blank rows = not tested)     CERVICAL AROM: All within normal limits   UPPER EXTREMITY STRENGTH: WFL     LYMPHEDEMA ASSESSMENTS:    LANDMARK RIGHT   eval 08/08/2022  10 cm proximal to olecranon process 32.7 32.7  Olecranon process 25.7 25.8  10 cm proximal to ulnar styloid process 24.3 23.1  Just proximal to ulnar styloid process 16.2 16.0  Across hand at thumb web space 17.9 19  At base of 2nd digit 6.4 6.3  (Blank rows = not tested)   LANDMARK LEFT   eval  10 cm proximal to olecranon process 32.2  Olecranon process 25.7  10 cm proximal to ulnar styloid process 22.7  Just proximal to ulnar styloid process 15.6  Across hand at thumb web space 17.7  At base of 2nd digit 6.1  (Blank rows = not tested)  Surgery type/Date: 07/14/2022 Right lumpectomy with SLNB Number of lymph nodes removed: 0/3 Current/past treatment (chemo, radiation, hormone therapy): pending radiation, anti estrogens Other symptoms:  Heaviness/tightness yes in breast Pain No Pitting edema No Infections No Decreased scar mobility Yes Stemmer sign No  PATIENT EDUCATION:  Education details: ABC class, reviewed exercises, Scar massage, SOZO, continue exercises during and for a period after radiation, and wear compression bra as long as skin tolerates it with radiation. Person educated: Patient Education method: Explanation Education comprehension: verbalized understanding  HOME EXERCISE PROGRAM: Reviewed previously given post op HEP.   ASSESSMENT:  CLINICAL IMPRESSION: Pt is s/p a right lumpectomy  with SLNB on 07/14/2022 with 0/3 LN's. She is pending radiation and anti estrogens . She has restored shoulder ROM to WNL, and there is no sign of UE swelling. Incisions are healing well with glue still present and several  small scabs. She will initiate scar massage when scabs have come off. She was set up today for ABC class and SOZO screens.  Pt will benefit from skilled therapeutic intervention to improve on the following deficits: Decreased knowledge of precautions, impaired UE functional use, pain, decreased ROM, postural dysfunction.   PT treatment/interventions: ADL/Self care home management, Therapeutic exercises, Patient/Family education, Self Care, and scar mobilization   GOALS: Goals reviewed with patient? Yes  LONG TERM GOALS:  (STG=LTG)  GOALS Name Target Date  Goal status  1 Pt will demonstrate she has regained full shoulder ROM and function post operatively compared to baselines.  Baseline: 08/08/2022 MET  2     3     4         PLAN:  PT FREQUENCY/DURATION: No further needs identified so no visits set up.  PLAN FOR NEXT SESSION: Pt has no present need for formal PT. She knows to message or call if she has questions or concerns. She is discharged.  PHYSICAL THERAPY DISCHARGE SUMMARY  Visits from Start of Care: 2  Current functional level related to goals / functional outcomes: Achieved goala   Remaining deficits: Mild lateral breast swelling with small amt of fibrosis   Education / Equipment: Pads made for axillary and lower border of bra for comfort and area of fibrosis above nipple   Patient agrees to discharge. Patient goals were met. Patient is being discharged due to meeting the stated rehab goals.  [facility]  [address]  [place] [address]  [phone]  After Breast Cancer Class It is recommended you attend the ABC class to be educated on lymphedema risk reduction. This class is free of charge and lasts for 1 hour. It is a 1-time class. You will need to download the Webex app either on your phone or computer. We will send you a link the night before or the morning of the class. You should be able to click on that link to join  the class. This is not a confidential class. You don't have to turn your camera on, but other participants may be able to see your email address.  Scar massage You can begin gentle scar massage to you incision sites. Gently place one hand on the incision and move the skin (without sliding on the skin) in various directions. Do this for a few minutes and then you can gently massage either coconut oil or vitamin E cream into the scars.  Compression garment You should continue wearing your compression bra until you feel like you no longer have swelling.  Home exercise Program Continue doing the exercises you were given until you feel like you can do them without feeling any tightness at the end.   Walking Program Studies show that 30 minutes of walking per day (fast enough to elevate your heart rate) can significantly reduce the risk of a cancer recurrence. If you can't walk due to other medical reasons, we encourage you to find another activity you could do (like a stationary bike or water exercise).  Posture After breast cancer surgery, people frequently sit with rounded shoulders posture because it puts their incisions on slack and feels better. If you sit like this and scar tissue forms in  that position, you can become very tight and have pain sitting or standing with good posture. Try to be aware of your posture and sit and stand up tall to heal properly.  Follow up PT: It is recommended you return every 3 months for the first 3 years following surgery to be assessed on the SOZO machine for an L-Dex score. This helps prevent clinically significant lymphedema in 95% of patients. These follow up screens are 10 minute appointments that you are not billed for.  Waynette Buttery, PT 08/08/2022, 12:56 PM

## 2022-08-08 ENCOUNTER — Other Ambulatory Visit: Payer: Self-pay

## 2022-08-08 ENCOUNTER — Ambulatory Visit
Admission: RE | Admit: 2022-08-08 | Discharge: 2022-08-08 | Disposition: A | Discharge status: Home / Self Care | Payer: BC Managed Care – PPO | Source: Ambulatory Visit | Attending: Radiation Oncology | Admitting: Radiation Oncology

## 2022-08-08 ENCOUNTER — Ambulatory Visit: Payer: BC Managed Care – PPO | Attending: Surgery

## 2022-08-08 ENCOUNTER — Encounter: Payer: Self-pay | Admitting: Radiation Oncology

## 2022-08-08 VITALS — BP 153/85 | HR 78 | Temp 97.6°F | Resp 18 | Ht 65.0 in | Wt 212.1 lb

## 2022-08-08 DIAGNOSIS — Z800 Family history of malignant neoplasm of digestive organs: Secondary | ICD-10-CM | POA: Insufficient documentation

## 2022-08-08 DIAGNOSIS — Z808 Family history of malignant neoplasm of other organs or systems: Secondary | ICD-10-CM | POA: Insufficient documentation

## 2022-08-08 DIAGNOSIS — Z170 Estrogen receptor positive status [ER+]: Secondary | ICD-10-CM | POA: Diagnosis present

## 2022-08-08 DIAGNOSIS — K219 Gastro-esophageal reflux disease without esophagitis: Secondary | ICD-10-CM | POA: Diagnosis not present

## 2022-08-08 DIAGNOSIS — Z7984 Long term (current) use of oral hypoglycemic drugs: Secondary | ICD-10-CM | POA: Insufficient documentation

## 2022-08-08 DIAGNOSIS — R293 Abnormal posture: Secondary | ICD-10-CM | POA: Diagnosis present

## 2022-08-08 DIAGNOSIS — Z803 Family history of malignant neoplasm of breast: Secondary | ICD-10-CM | POA: Diagnosis not present

## 2022-08-08 DIAGNOSIS — Z510 Encounter for antineoplastic radiation therapy: Secondary | ICD-10-CM | POA: Insufficient documentation

## 2022-08-08 DIAGNOSIS — I10 Essential (primary) hypertension: Secondary | ICD-10-CM | POA: Insufficient documentation

## 2022-08-08 DIAGNOSIS — C50411 Malignant neoplasm of upper-outer quadrant of right female breast: Secondary | ICD-10-CM

## 2022-08-08 DIAGNOSIS — Z79899 Other long term (current) drug therapy: Secondary | ICD-10-CM | POA: Diagnosis not present

## 2022-08-08 DIAGNOSIS — G4730 Sleep apnea, unspecified: Secondary | ICD-10-CM | POA: Diagnosis not present

## 2022-08-08 NOTE — Progress Notes (Signed)
Consult nursing interview for a 61yrold female w/ Malignant neoplasm of upper-outer quadrant of right breast in female, estrogen receptor positive ([place].  I verified patient's identity and began nursing interview w/ spouse Mr. DArthea Nobelin attendance. Patient reports RT breast tenderness 3/10 at incision line. No other issues reported at this time.  Meaningful use complete. Hysterectomy- NO chances of pregnancy.  BP (!) 153/85 (BP Location: Left Arm, Patient Position: Sitting, Cuff Size: Large)   Pulse 78   Temp 97.6 F (36.4 C) (Oral)   Resp 18   Ht '5\' 5"'$  (1.651 m)   Wt 212 lb 2 oz (96.2 kg)   SpO2 100%   BMI 35.30 kg/m   This concludes this nursing interview.  MLeandra Kern LPN

## 2022-08-08 NOTE — Progress Notes (Signed)
Radiation Oncology         [phone] ________________________________  Name: Janice Wagner        MRN: [id number]  Date of Service: 08/08/2022 DOB: 09-26-1960  KC:LEXNTZ, Clarita Crane., MD  Truitt Merle, MD     REFERRING PHYSICIAN: Truitt Merle, MD   DIAGNOSIS: The encounter diagnosis was Malignant neoplasm of upper-outer quadrant of right breast in female, estrogen receptor positive ([place]).   HISTORY OF PRESENT ILLNESS: Janice Wagner is a 61 y.o. female originally seen in the multidisciplinary breast clinic for a new diagnosis of right breast cancer. The patient was noted to have a screening detected mass in the right breast.  She underwent further diagnostic work-up including ultrasound which identified a 1.8 cm mass with microlobulated margin in the right breast at the 12 o'clock position and an adjacent 5 mm mass also in the 12 o'clock position was noted.  There was also a 1.7 cm oval mass with microlobulated margin in the right breast in the 9-9 30 o'clock position and superior to this a 7 mm mass from 9:30-10 o'clock.  There were 2 additional small 4 and 5 mm hypoechoic masses between 9-10 o'clock.  The axilla was negative for adenopathy.  She underwent a biopsy on 06/19/2022 the 12:00 specimen showed a grade 2 invasive ductal carcinoma with microcalcifications, this was ER/PR positive, HER2 was negative with a Ki-67 of 10%. The  9:30 biopsy showed benign breast tissue with fibrocystic changes stromal fibrosis and usual ductal hyperplasia with posh no evidence of malignancy was present.  The 10:00 lesion was also compatible with a benign fibroadenoma with rare microcalcifications present negative for malignancy.    Since her last visit, she underwent a right lumpectomy and sentinel node biopsy on 07/14/22 that revealed a grade 2 invasive ductal carcinoma the measured 1.7 cm. Her margins were negative, with the closest being 1 mm from the lateral and inferior margins for invasive disease. Her 3  sampled nodes were negative for disease. She had Oncotype Dx testing and her score was 14 and no systemic therapy was recommended. She's seen today to move forward with adjuvant radiotherapy.    PREVIOUS RADIATION THERAPY: No   PAST MEDICAL HISTORY:  Past Medical History:  Diagnosis Date   Arthritis    Empty sella syndrome ([place])    Family history of breast cancer 06/28/2022   Family history of colon cancer 06/28/2022   GERD (gastroesophageal reflux disease)    H/O: vasectomy    husband with vasectomy   Hypertension    Insulin resistance    Migraines    Ovarian cyst    Reflux    Sleep apnea    has used CPAP, not currently       PAST SURGICAL HISTORY: Past Surgical History:  Procedure Laterality Date   ABDOMINAL SURGERY     Laparotomy-Ovarian cyst   BREAST LUMPECTOMY WITH RADIOACTIVE SEED AND SENTINEL LYMPH NODE BIOPSY Right 07/14/2022   Procedure: RIGHT BREAST LUMPECTOMY WITH RADIOACTIVE SEED AND SENTINEL LYMPH NODE BIOPSY;  Surgeon: Coralie Keens, MD;  Location: [place];  Service: General;  Laterality: Right;   [place]  11/2002   VAGINAL HYST., POSTERIOR REPAIR     FAMILY HISTORY:  Family History  Problem Relation Age of Onset   Colon polyps Mother        ~10   Colon cancer Maternal Uncle 65   Skin cancer Maternal Uncle  Breast cancer Maternal Grandmother 61   Hypertension Maternal Grandfather    Breast cancer Other        MGM's sisters x2; dx after 8     SOCIAL HISTORY:  reports that she has never smoked. She has never used smokeless tobacco. She reports current alcohol use. She reports that she does not use drugs.  The patient is married and lives in [place]. She's accompanied by her husband and adult daughter. She is recently retired from working for the school system. She enjoys spending time with a grandchild.    ALLERGIES: Penicillins   MEDICATIONS:  Current  Outpatient Medications  Medication Sig Dispense Refill   amLODipine (NORVASC) 5 MG tablet Take 5 mg by mouth daily.     celecoxib (CELEBREX) 200 MG capsule Take 200 mg by mouth daily.     Cetirizine HCl (ZYRTEC ALLERGY PO) Take by mouth.     DULoxetine (CYMBALTA) 30 MG capsule Take 30 mg by mouth daily.     famotidine (PEPCID) 40 MG tablet Take 40 mg by mouth 2 (two) times daily.     lisinopril (ZESTRIL) 20 MG tablet Take 20 mg by mouth daily.     metFORMIN (GLUCOPHAGE-XR) 500 MG 24 hr tablet Take 500 mg by mouth 2 (two) times daily.     ondansetron (ZOFRAN) 4 MG tablet Take 1 tablet (4 mg total) by mouth every 8 (eight) hours as needed for nausea or vomiting. 20 tablet 0   ondansetron (ZOFRAN-ODT) 4 MG disintegrating tablet Take 4 mg by mouth every 8 (eight) hours as needed for nausea or vomiting.     topiramate (TOPAMAX) 25 MG tablet Take 25 mg by mouth daily.     traMADol (ULTRAM) 50 MG tablet Take 1 tablet (50 mg total) by mouth every 6 (six) hours as needed for moderate pain or severe pain. 25 tablet 0   No current facility-administered medications for this encounter.     REVIEW OF SYSTEMS: On review of systems, the patient reports that she is doing well since surgery. She has concerns about being able to get her nails done and shave her underarms. No other complaints are verbalized.      PHYSICAL EXAM:  Wt Readings from Last 3 Encounters:  08/08/22 212 lb 2 oz (96.2 kg)  07/14/22 212 lb 4.9 oz (96.3 kg)  06/28/22 215 lb 8 oz (97.8 kg)   Temp Readings from Last 3 Encounters:  08/08/22 97.6 F (36.4 C) (Oral)  07/14/22 98.2 F (36.8 C)  06/28/22 97.8 F (36.6 C) (Temporal)   BP Readings from Last 3 Encounters:  08/08/22 (!) 153/85  07/14/22 (!) 121/55  06/28/22 139/77   Pulse Readings from Last 3 Encounters:  08/08/22 78  07/14/22 67  06/28/22 85     In general this is a well appearing caucasian female in no acute distress. She's alert and oriented x4 and  appropriate throughout the examination. Cardiopulmonary assessment is negative for acute distress and she exhibits normal effort. The right breast has a well healed incision as well as the right axilla. Neither incision revealed erythema, separation, or drainage.     ECOG = 0  0 - Asymptomatic (Fully active, able to carry on all predisease activities without restriction)  1 - Symptomatic but completely ambulatory (Restricted in physically strenuous activity but ambulatory and able to carry out work of a light or sedentary nature. For example, light housework, office work)  2 - Symptomatic, <50% in bed during the day (  Ambulatory and capable of all self care but unable to carry out any work activities. Up and about more than 50% of waking hours)  3 - Symptomatic, >50% in bed, but not bedbound (Capable of only limited self-care, confined to bed or chair 50% or more of waking hours)  4 - Bedbound (Completely disabled. Cannot carry on any self-care. Totally confined to bed or chair)  5 - Death   Eustace Pen MM, Creech RH, Tormey DC, et al. [phone]). "Toxicity and response criteria of the [facility]". [place] Oncol. 5 (6): 649-55    LABORATORY DATA:  Lab Results  Component Value Date   WBC 9.7 06/28/2022   HGB 12.6 06/28/2022   HCT 39.6 06/28/2022   MCV 83.2 06/28/2022   PLT 386 06/28/2022   Lab Results  Component Value Date   NA 139 06/28/2022   K 3.9 06/28/2022   CL 106 06/28/2022   CO2 26 06/28/2022   Lab Results  Component Value Date   ALT 15 06/28/2022   AST 18 06/28/2022   ALKPHOS 104 06/28/2022   BILITOT 0.7 06/28/2022      RADIOGRAPHY: No results found.     IMPRESSION/PLAN: 1. Stage IA, [id number], grade 2, ER/PR positive invasive ductal carcinoma of the right breast. Dr. Lisbeth Renshaw has reviewed the final pathology findings and today I reviewed the nature of early stage right breast disease. She has done well since surgery and does not need systemic  chemotherapy. Dr. Lisbeth Renshaw recommends external radiotherapy to the breast  to reduce risks of local recurrence. We discussed the risks, benefits, short, and long term effects of radiotherapy, as well as the curative intent, and the patient is interested in proceeding. I reviewed the delivery and logistics of radiotherapy and Dr. Lisbeth Renshaw recommends 4 weeks of radiotherapy to the right breast. Written consent is obtained and placed in the chart, a copy was provided to the patient. She will simulate today later this afternoon.   In a visit lasting 45 minutes, greater than 50% of the time was spent face to face reviewing her case, as well as in preparation of, discussing, and coordinating the patient's care.      Carola Rhine, [facility]    **Disclaimer: This note was dictated with voice recognition software. Similar sounding words can inadvertently be transcribed and this note may contain transcription errors which may not have been corrected upon publication of note.**

## 2022-08-08 NOTE — Patient Instructions (Signed)
     [facility]  [address] [place] [address]  [phone]  After Breast Cancer Class: November 20, 12:00 It is recommended you attend the ABC class to be educated on lymphedema risk reduction. This class is free of charge and lasts for 1 hour. It is a 1-time class. You will need to download the Webex app either on your phone or computer. We will send you a link the night before or the morning of the class. You should be able to click on that link to join the class. This is not a confidential class. You don't have to turn your camera on, but other participants may be able to see your email address.  Scar massage You can begin gentle scar massage to you incision sites. Gently place one hand on the incision and move the skin (without sliding on the skin) in various directions. Do this for a few minutes and then you can gently massage either coconut oil or vitamin E cream into the scars.  Compression garment You should continue wearing your compression bra until you feel like you no longer have swelling.  Home exercise Program Continue doing the exercises you were given until you feel like you can do them without feeling any tightness at the end.   Walking Program Studies show that 30 minutes of walking per day (fast enough to elevate your heart rate) can significantly reduce the risk of a cancer recurrence. If you can't walk due to other medical reasons, we encourage you to find another activity you could do (like a stationary bike or water exercise).  Posture After breast cancer surgery, people frequently sit with rounded shoulders posture because it puts their incisions on slack and feels better. If you sit like this and scar tissue forms in that position, you can become very tight and have pain sitting or standing with good posture. Try to be aware of your posture and sit and stand up tall to heal properly.  Follow up PT: It is recommended you  return every 3 months for the first 2 years following surgery to be assessed on the SOZO machine for an L-Dex score. This helps prevent clinically significant lymphedema in 95% of patients. These follow up screens are 10 minute appointments that you are not billed for.

## 2022-08-09 ENCOUNTER — Encounter: Payer: Self-pay | Admitting: *Deleted

## 2022-08-15 ENCOUNTER — Other Ambulatory Visit: Payer: Self-pay | Admitting: Radiation Oncology

## 2022-08-15 ENCOUNTER — Inpatient Hospital Stay
Admission: RE | Admit: 2022-08-15 | Discharge: 2022-08-15 | Disposition: A | Discharge status: Home / Self Care | Payer: Self-pay | Source: Ambulatory Visit | Attending: Radiation Oncology | Admitting: Radiation Oncology

## 2022-08-15 ENCOUNTER — Ambulatory Visit
Admission: RE | Admit: 2022-08-15 | Discharge: 2022-08-15 | Disposition: A | Discharge status: Home / Self Care | Payer: Self-pay | Source: Ambulatory Visit | Attending: Radiation Oncology | Admitting: Radiation Oncology

## 2022-08-15 DIAGNOSIS — Z170 Estrogen receptor positive status [ER+]: Secondary | ICD-10-CM

## 2022-08-15 DIAGNOSIS — C50411 Malignant neoplasm of upper-outer quadrant of right female breast: Secondary | ICD-10-CM

## 2022-08-15 NOTE — Progress Notes (Signed)
HPI:   Janice Wagner was previously seen in the [place] clinic due to a personal and family history of breast cancer and concerns regarding a hereditary predisposition to cancer. Please refer to our prior cancer genetics clinic note for more information regarding our discussion, assessment and recommendations, at the time. Janice Wagner recent genetic test results were disclosed to her, as were recommendations warranted by these results. These results and recommendations are discussed in more detail below.  CANCER HISTORY:  In 2023, at the age of 61, Janice Wagner was diagnosed with invasive ductal carcinoma of the right breast (ER+/PR+/HER2-).    Oncology History  Malignant neoplasm of upper-outer quadrant of right breast in female, estrogen receptor positive ([place])  06/19/2022 Initial Biopsy   Diagnosis 1. Breast, right, needle core biopsy, 12 o'clock, 2cmfn INVASIVE MODERATELY DIFFERENTIATED DUCTAL ADENOCARCINOMA, GRADE 2 (3+2+1) MICROCALCIFICATIONS PRESENT NEGATIVE FOR LYMPHOVASCULAR INVASION TUMOR MEASURES 6 MM IN GREATEST LINEAR EXTENT 2. Breast, right, needle core biopsy, 9:30 o'clock, 6cmfn BENIGN BREAST WITH FIBROCYSTIC CHANGES INCLUDING STROMAL FIBROSIS AND USUAL DUCT HYPERPLASIA PSEUDOANGIOMATOUS STROMAL HYPERPLASIA (PASH) NEGATIVE FOR MICROCALCIFICATIONS NEGATIVE FOR CARCINOMA 3. Breast, right, needle core biopsy, 10 o'clock, 8cmfn COMPATIBLE WITH A BENIGN FIBROADENOMA RARE MICROCALCIFICATIONS PRESENT NEGATIVE FOR MALIGNANCY Diagnosis Note 1. An immunohistochemical stain for E-cadherin is performed with adequate control and is positive within the tumor supporting a ductal differentiation.  1. PROGNOSTIC INDICATORS Results: The tumor cells are equivocal for Her2 (2+). Her2 by FISH will be performed and the results reported separately. Estrogen Receptor: 100%, POSITIVE, STRONG STAINING INTENSITY Progesterone Receptor: 90%, POSITIVE, STRONG STAINING  INTENSITY Proliferation Marker Ki67: 10%  1. FLUORESCENCE IN-SITU HYBRIDIZATION Results: GROUP 5: HER2 **NEGATIVE**   06/26/2022 Initial Diagnosis   Malignant neoplasm of upper-outer quadrant of right breast in female, estrogen receptor positive ([place])   06/26/2022 Cancer Staging   Staging form: Breast, AJCC 8th Edition - Clinical: Stage IA (cT1c, cN0, cM0, G2, ER+, PR+, HER2-) - Signed by Hayden Pedro, PA-C on 06/26/2022 Stage prefix: Initial diagnosis Method of lymph node assessment: Clinical Histologic grading system: 3 grade system   07/07/2022 Genetic Testing   Negative hereditary cancer genetic testing: no pathogenic variants detected in [place] Expanded +RNAinsight Panel.  VUS in PTCH1 at p.P725R (c.2174C>G).   Report date is 07/07/2022.   The CancerNext-Expanded gene panel offered by [facility] and includes sequencing, rearrangement, and RNA analysis for the following 77 genes: AIP, ALK, APC, ATM, AXIN2, BAP1, BARD1, BLM, BMPR1A, BRCA1, BRCA2, BRIP1, CDC73, CDH1, CDK4, CDKN1B, CDKN2A, CHEK2, CTNNA1, DICER1, FANCC, FH, FLCN, GALNT12, KIF1B, LZTR1, MAX, MEN1, MET, MLH1, MSH2, MSH3, MSH6, MUTYH, NBN, NF1, NF2, NTHL1, PALB2, PHOX2B, PMS2, POT1, PRKAR1A, PTCH1, PTEN, RAD51C, RAD51D, RB1, RECQL, RET, SDHA, SDHAF2, SDHB, SDHC, SDHD, SMAD4, SMARCA4, SMARCB1, SMARCE1, STK11, SUFU, TMEM127, TP53, TSC1, TSC2, VHL and XRCC2 (sequencing and deletion/duplication); EGFR, EGLN1, HOXB13, KIT, MITF, PDGFRA, POLD1, and POLE (sequencing only); EPCAM and GREM1 (deletion/duplication only).      FAMILY HISTORY:  We obtained a detailed, 4-generation family history.  Significant diagnoses are listed below:      Family History  Problem Relation Age of Onset   Colon polyps Mother          ~10   Colon cancer Maternal Uncle 90   Skin cancer Maternal Uncle     Breast cancer Maternal Grandmother 68   Hypertension Maternal Grandfather     Breast cancer Other          MGM's sisters x2;  dx after 75       Janice Wagner is unaware of previous family history of genetic testing for hereditary cancer risks. There is no reported Ashkenazi Jewish ancestry. There is no known consanguinity.    GENETIC TEST RESULTS:  The [place] CancerNext-Expanded +RNAinsight Panel found no pathogenic mutations.   The CancerNext-Expanded gene panel offered by [facility] and includes sequencing, rearrangement, and RNA analysis for the following 77 genes: AIP, ALK, APC, ATM, AXIN2, BAP1, BARD1, BLM, BMPR1A, BRCA1, BRCA2, BRIP1, CDC73, CDH1, CDK4, CDKN1B, CDKN2A, CHEK2, CTNNA1, DICER1, FANCC, FH, FLCN, GALNT12, KIF1B, LZTR1, MAX, MEN1, MET, MLH1, MSH2, MSH3, MSH6, MUTYH, NBN, NF1, NF2, NTHL1, PALB2, PHOX2B, PMS2, POT1, PRKAR1A, PTCH1, PTEN, RAD51C, RAD51D, RB1, RECQL, RET, SDHA, SDHAF2, SDHB, SDHC, SDHD, SMAD4, SMARCA4, SMARCB1, SMARCE1, STK11, SUFU, TMEM127, TP53, TSC1, TSC2, VHL and XRCC2 (sequencing and deletion/duplication); EGFR, EGLN1, HOXB13, KIT, MITF, PDGFRA, POLD1, and POLE (sequencing only); EPCAM and GREM1 (deletion/duplication only).    The test report has been scanned into EPIC and is located under the Molecular Pathology section of the Results Review tab.  A portion of the result report is included below for reference. Genetic testing reported out on July 07, 2022.     Genetic testing identified a variant of uncertain significance (VUS) in the PTCH1 gene called  p.P725R (c.2174C>G) .  At this time, it is unknown if this variant is associated with an increased risk for cancer or if it is benign, but most uncertain variants are reclassified to benign. It should not be used to make medical management decisions. With time, we suspect the laboratory will determine the significance of this variant, if any. If the laboratory reclassifies this variant, we will attempt to contact Janice Wagner to discuss it further.   Even though a pathogenic variant was not identified, possible explanations for the  cancer in the family may include: There may be no hereditary risk for cancer in the family. The cancers in Janice Wagner and/or her family may be sporadic/familial or due to other genetic and environmental factors. There may be a gene mutation in one of these genes that current testing methods cannot detect but that chance is small. There could be another gene that has not yet been discovered, or that we have not yet tested, that is responsible for the cancer diagnoses in the family.  It is also possible there is a hereditary cause for the cancer in the family that Janice Wagner did not inherit.  Therefore, it is important to remain in touch with cancer genetics in the future so that we can continue to offer Janice Wagner the most up to date genetic testing.       ADDITIONAL GENETIC TESTING:  We discussed with Janice Wagner that her genetic testing was fairly extensive.  If there are additional relevant genes identified to increase cancer risk that can be analyzed in the future, we would be happy to discuss and coordinate this testing at that time.      CANCER SCREENING RECOMMENDATIONS:  Janice Wagner test result is considered negative (normal).  This means that we have not identified a hereditary cause for her personal history of breast cancer at this time.    An individual's cancer risk and medical management are not determined by genetic test results alone. Overall cancer risk assessment incorporates additional factors, including personal medical history, family history, and any available genetic information that may result in a personalized plan for cancer prevention and surveillance. Therefore, it is recommended she continue  to follow the cancer management and screening guidelines provided by her oncology and primary healthcare provider.  RECOMMENDATIONS FOR FAMILY MEMBERS:   Since she did not inherit a identifiable mutation in a cancer predisposition gene included on this panel, her children could not  have inherited a known mutation from her in one of these genes. Individuals in this family might be at some increased risk of developing cancer, over the general population risk, due to the family history of cancer.  Individuals in the family should notify their providers of the family history of cancer. We recommend women in this family have a yearly mammogram beginning at age 27, or 56 years younger than the earliest onset of cancer, an annual clinical breast exam, and perform monthly breast self-exams.  We do not recommend familial testing for the PTCH1 variant of uncertain significance (VUS).  FOLLOW-UP:  Lastly, we discussed with Janice Wagner that cancer genetics is a rapidly advancing field and it is possible that new genetic tests will be appropriate for her and/or her family members in the future. We encouraged her to remain in contact with cancer genetics on an annual basis so we can update her personal and family histories and let her know of advances in cancer genetics that may benefit this family.   Our contact number was provided. Janice Wagner questions were answered to her satisfaction, and she knows she is welcome to call us at anytime with additional questions or concerns.   Mykale Gandolfo M. Joette Catching, Blue, [facility] Genetic Counselor [email or web address] [phone]) [phone]

## 2022-08-16 DIAGNOSIS — Z510 Encounter for antineoplastic radiation therapy: Secondary | ICD-10-CM | POA: Diagnosis not present

## 2022-08-21 ENCOUNTER — Other Ambulatory Visit: Payer: Self-pay

## 2022-08-21 ENCOUNTER — Ambulatory Visit
Admission: RE | Admit: 2022-08-21 | Discharge: 2022-08-21 | Disposition: A | Discharge status: Home / Self Care | Payer: BC Managed Care – PPO | Source: Ambulatory Visit | Attending: Radiation Oncology | Admitting: Radiation Oncology

## 2022-08-21 DIAGNOSIS — Z510 Encounter for antineoplastic radiation therapy: Secondary | ICD-10-CM | POA: Diagnosis not present

## 2022-08-21 LAB — RAD ONC ARIA SESSION SUMMARY
Course Elapsed Days: 0
Plan Fractions Treated to Date: 1
Plan Prescribed Dose Per Fraction: 2.66 Gy
Plan Total Fractions Prescribed: 16
Plan Total Prescribed Dose: 42.56 Gy
Reference Point Dosage Given to Date: 2.66 Gy
Reference Point Session Dosage Given: 2.66 Gy
Session Number: 1

## 2022-08-22 ENCOUNTER — Other Ambulatory Visit: Payer: Self-pay

## 2022-08-22 ENCOUNTER — Ambulatory Visit
Admission: RE | Admit: 2022-08-22 | Discharge: 2022-08-22 | Disposition: A | Discharge status: Home / Self Care | Payer: BC Managed Care – PPO | Source: Ambulatory Visit | Attending: Radiation Oncology | Admitting: Radiation Oncology

## 2022-08-22 DIAGNOSIS — Z510 Encounter for antineoplastic radiation therapy: Secondary | ICD-10-CM | POA: Diagnosis not present

## 2022-08-22 LAB — RAD ONC ARIA SESSION SUMMARY
Course Elapsed Days: 1
Plan Fractions Treated to Date: 2
Plan Prescribed Dose Per Fraction: 2.66 Gy
Plan Total Fractions Prescribed: 16
Plan Total Prescribed Dose: 42.56 Gy
Reference Point Dosage Given to Date: 5.32 Gy
Reference Point Session Dosage Given: 2.66 Gy
Session Number: 2

## 2022-08-23 ENCOUNTER — Ambulatory Visit
Admission: RE | Admit: 2022-08-23 | Discharge: 2022-08-23 | Disposition: A | Discharge status: Home / Self Care | Payer: BC Managed Care – PPO | Source: Ambulatory Visit | Attending: Radiation Oncology | Admitting: Radiation Oncology

## 2022-08-23 ENCOUNTER — Other Ambulatory Visit: Payer: Self-pay

## 2022-08-23 DIAGNOSIS — Z510 Encounter for antineoplastic radiation therapy: Secondary | ICD-10-CM | POA: Diagnosis not present

## 2022-08-23 LAB — RAD ONC ARIA SESSION SUMMARY
Course Elapsed Days: 2
Plan Fractions Treated to Date: 3
Plan Prescribed Dose Per Fraction: 2.66 Gy
Plan Total Fractions Prescribed: 16
Plan Total Prescribed Dose: 42.56 Gy
Reference Point Dosage Given to Date: 7.98 Gy
Reference Point Session Dosage Given: 2.66 Gy
Session Number: 3

## 2022-08-24 ENCOUNTER — Ambulatory Visit
Admission: RE | Admit: 2022-08-24 | Discharge: 2022-08-24 | Disposition: A | Discharge status: Home / Self Care | Payer: BC Managed Care – PPO | Source: Ambulatory Visit | Attending: Radiation Oncology | Admitting: Radiation Oncology

## 2022-08-24 ENCOUNTER — Other Ambulatory Visit: Payer: Self-pay

## 2022-08-24 DIAGNOSIS — Z510 Encounter for antineoplastic radiation therapy: Secondary | ICD-10-CM | POA: Diagnosis not present

## 2022-08-24 LAB — RAD ONC ARIA SESSION SUMMARY
Course Elapsed Days: 3
Plan Fractions Treated to Date: 4
Plan Prescribed Dose Per Fraction: 2.66 Gy
Plan Total Fractions Prescribed: 16
Plan Total Prescribed Dose: 42.56 Gy
Reference Point Dosage Given to Date: 10.64 Gy
Reference Point Session Dosage Given: 2.66 Gy
Session Number: 4

## 2022-08-25 ENCOUNTER — Other Ambulatory Visit: Payer: Self-pay

## 2022-08-25 ENCOUNTER — Ambulatory Visit
Admission: RE | Admit: 2022-08-25 | Discharge: 2022-08-25 | Disposition: A | Discharge status: Home / Self Care | Payer: BC Managed Care – PPO | Source: Ambulatory Visit | Attending: Radiation Oncology | Admitting: Radiation Oncology

## 2022-08-25 DIAGNOSIS — I10 Essential (primary) hypertension: Secondary | ICD-10-CM | POA: Insufficient documentation

## 2022-08-25 DIAGNOSIS — Z90710 Acquired absence of both cervix and uterus: Secondary | ICD-10-CM | POA: Insufficient documentation

## 2022-08-25 DIAGNOSIS — C50411 Malignant neoplasm of upper-outer quadrant of right female breast: Secondary | ICD-10-CM | POA: Insufficient documentation

## 2022-08-25 DIAGNOSIS — Z170 Estrogen receptor positive status [ER+]: Secondary | ICD-10-CM | POA: Insufficient documentation

## 2022-08-25 DIAGNOSIS — Z803 Family history of malignant neoplasm of breast: Secondary | ICD-10-CM | POA: Insufficient documentation

## 2022-08-25 DIAGNOSIS — Z800 Family history of malignant neoplasm of digestive organs: Secondary | ICD-10-CM | POA: Diagnosis not present

## 2022-08-25 LAB — RAD ONC ARIA SESSION SUMMARY
Course Elapsed Days: 4
Plan Fractions Treated to Date: 5
Plan Prescribed Dose Per Fraction: 2.66 Gy
Plan Total Fractions Prescribed: 16
Plan Total Prescribed Dose: 42.56 Gy
Reference Point Dosage Given to Date: 13.3 Gy
Reference Point Session Dosage Given: 2.66 Gy
Session Number: 5

## 2022-08-25 MED ORDER — RADIAPLEXRX EX GEL: Freq: Once | CUTANEOUS | Status: AC

## 2022-08-25 MED ORDER — ALRA NON-METALLIC DEODORANT (RAD-ONC)
1.0000 | Freq: Once | TOPICAL | Status: AC
  Administered 2022-08-25: 1 via TOPICAL

## 2022-08-28 ENCOUNTER — Ambulatory Visit
Admission: RE | Admit: 2022-08-28 | Discharge: 2022-08-28 | Disposition: A | Discharge status: Home / Self Care | Payer: BC Managed Care – PPO | Source: Ambulatory Visit | Attending: Radiation Oncology | Admitting: Radiation Oncology

## 2022-08-28 ENCOUNTER — Other Ambulatory Visit: Payer: Self-pay

## 2022-08-28 DIAGNOSIS — C50411 Malignant neoplasm of upper-outer quadrant of right female breast: Secondary | ICD-10-CM | POA: Diagnosis not present

## 2022-08-28 LAB — RAD ONC ARIA SESSION SUMMARY
Course Elapsed Days: 7
Plan Fractions Treated to Date: 6
Plan Prescribed Dose Per Fraction: 2.66 Gy
Plan Total Fractions Prescribed: 16
Plan Total Prescribed Dose: 42.56 Gy
Reference Point Dosage Given to Date: 15.96 Gy
Reference Point Session Dosage Given: 2.66 Gy
Session Number: 6

## 2022-08-29 ENCOUNTER — Other Ambulatory Visit: Payer: Self-pay

## 2022-08-29 ENCOUNTER — Ambulatory Visit
Admission: RE | Admit: 2022-08-29 | Discharge: 2022-08-29 | Disposition: A | Discharge status: Home / Self Care | Payer: BC Managed Care – PPO | Source: Ambulatory Visit | Attending: Radiation Oncology | Admitting: Radiation Oncology

## 2022-08-29 DIAGNOSIS — C50411 Malignant neoplasm of upper-outer quadrant of right female breast: Secondary | ICD-10-CM | POA: Diagnosis not present

## 2022-08-29 LAB — RAD ONC ARIA SESSION SUMMARY
Course Elapsed Days: 8
Plan Fractions Treated to Date: 7
Plan Prescribed Dose Per Fraction: 2.66 Gy
Plan Total Fractions Prescribed: 16
Plan Total Prescribed Dose: 42.56 Gy
Reference Point Dosage Given to Date: 18.62 Gy
Reference Point Session Dosage Given: 2.66 Gy
Session Number: 7

## 2022-08-30 ENCOUNTER — Other Ambulatory Visit: Payer: Self-pay

## 2022-08-30 ENCOUNTER — Ambulatory Visit
Admission: RE | Admit: 2022-08-30 | Discharge: 2022-08-30 | Disposition: A | Discharge status: Home / Self Care | Payer: BC Managed Care – PPO | Source: Ambulatory Visit | Attending: Radiation Oncology | Admitting: Radiation Oncology

## 2022-08-30 DIAGNOSIS — C50411 Malignant neoplasm of upper-outer quadrant of right female breast: Secondary | ICD-10-CM | POA: Diagnosis not present

## 2022-08-30 LAB — RAD ONC ARIA SESSION SUMMARY
Course Elapsed Days: 9
Plan Fractions Treated to Date: 8
Plan Prescribed Dose Per Fraction: 2.66 Gy
Plan Total Fractions Prescribed: 16
Plan Total Prescribed Dose: 42.56 Gy
Reference Point Dosage Given to Date: 21.28 Gy
Reference Point Session Dosage Given: 2.66 Gy
Session Number: 8

## 2022-08-31 ENCOUNTER — Ambulatory Visit
Admission: RE | Admit: 2022-08-31 | Discharge: 2022-08-31 | Disposition: A | Discharge status: Home / Self Care | Payer: BC Managed Care – PPO | Source: Ambulatory Visit | Attending: Radiation Oncology | Admitting: Radiation Oncology

## 2022-08-31 ENCOUNTER — Other Ambulatory Visit: Payer: Self-pay

## 2022-08-31 DIAGNOSIS — C50411 Malignant neoplasm of upper-outer quadrant of right female breast: Secondary | ICD-10-CM | POA: Diagnosis not present

## 2022-08-31 LAB — RAD ONC ARIA SESSION SUMMARY
Course Elapsed Days: 10
Plan Fractions Treated to Date: 9
Plan Prescribed Dose Per Fraction: 2.66 Gy
Plan Total Fractions Prescribed: 16
Plan Total Prescribed Dose: 42.56 Gy
Reference Point Dosage Given to Date: 23.94 Gy
Reference Point Session Dosage Given: 2.66 Gy
Session Number: 9

## 2022-09-01 ENCOUNTER — Other Ambulatory Visit: Payer: Self-pay

## 2022-09-01 ENCOUNTER — Ambulatory Visit
Admission: RE | Admit: 2022-09-01 | Discharge: 2022-09-01 | Disposition: A | Discharge status: Home / Self Care | Payer: BC Managed Care – PPO | Source: Ambulatory Visit | Attending: Radiation Oncology | Admitting: Radiation Oncology

## 2022-09-01 DIAGNOSIS — C50411 Malignant neoplasm of upper-outer quadrant of right female breast: Secondary | ICD-10-CM | POA: Diagnosis not present

## 2022-09-01 LAB — RAD ONC ARIA SESSION SUMMARY
Course Elapsed Days: 11
Plan Fractions Treated to Date: 10
Plan Prescribed Dose Per Fraction: 2.66 Gy
Plan Total Fractions Prescribed: 16
Plan Total Prescribed Dose: 42.56 Gy
Reference Point Dosage Given to Date: 26.6 Gy
Reference Point Session Dosage Given: 2.66 Gy
Session Number: 10

## 2022-09-04 ENCOUNTER — Other Ambulatory Visit: Payer: Self-pay

## 2022-09-04 ENCOUNTER — Ambulatory Visit
Admission: RE | Admit: 2022-09-04 | Discharge: 2022-09-04 | Disposition: A | Discharge status: Home / Self Care | Payer: BC Managed Care – PPO | Source: Ambulatory Visit | Attending: Radiation Oncology | Admitting: Radiation Oncology

## 2022-09-04 DIAGNOSIS — C50411 Malignant neoplasm of upper-outer quadrant of right female breast: Secondary | ICD-10-CM | POA: Diagnosis not present

## 2022-09-04 LAB — RAD ONC ARIA SESSION SUMMARY
Course Elapsed Days: 14
Plan Fractions Treated to Date: 11
Plan Prescribed Dose Per Fraction: 2.66 Gy
Plan Total Fractions Prescribed: 16
Plan Total Prescribed Dose: 42.56 Gy
Reference Point Dosage Given to Date: 29.26 Gy
Reference Point Session Dosage Given: 2.66 Gy
Session Number: 11

## 2022-09-05 ENCOUNTER — Other Ambulatory Visit: Payer: Self-pay

## 2022-09-05 ENCOUNTER — Ambulatory Visit
Admission: RE | Admit: 2022-09-05 | Discharge: 2022-09-05 | Disposition: A | Discharge status: Home / Self Care | Payer: BC Managed Care – PPO | Source: Ambulatory Visit | Attending: Radiation Oncology | Admitting: Radiation Oncology

## 2022-09-05 ENCOUNTER — Telehealth: Payer: Self-pay | Admitting: Genetic Counselor

## 2022-09-05 DIAGNOSIS — C50411 Malignant neoplasm of upper-outer quadrant of right female breast: Secondary | ICD-10-CM | POA: Diagnosis not present

## 2022-09-05 LAB — RAD ONC ARIA SESSION SUMMARY
Course Elapsed Days: 15
Plan Fractions Treated to Date: 12
Plan Prescribed Dose Per Fraction: 2.66 Gy
Plan Total Fractions Prescribed: 16
Plan Total Prescribed Dose: 42.56 Gy
Reference Point Dosage Given to Date: 31.92 Gy
Reference Point Session Dosage Given: 2.66 Gy
Session Number: 12

## 2022-09-05 NOTE — Telephone Encounter (Signed)
Patient called asking how to set up genetics appt for husband who has FH cancer.  Returned call and left contact information so he can call to schedule.

## 2022-09-06 ENCOUNTER — Other Ambulatory Visit: Payer: Self-pay

## 2022-09-06 ENCOUNTER — Ambulatory Visit
Admission: RE | Admit: 2022-09-06 | Discharge: 2022-09-06 | Disposition: A | Discharge status: Home / Self Care | Payer: BC Managed Care – PPO | Source: Ambulatory Visit | Attending: Radiation Oncology | Admitting: Radiation Oncology

## 2022-09-06 DIAGNOSIS — C50411 Malignant neoplasm of upper-outer quadrant of right female breast: Secondary | ICD-10-CM | POA: Diagnosis not present

## 2022-09-06 LAB — RAD ONC ARIA SESSION SUMMARY
Course Elapsed Days: 16
Plan Fractions Treated to Date: 13
Plan Prescribed Dose Per Fraction: 2.66 Gy
Plan Total Fractions Prescribed: 16
Plan Total Prescribed Dose: 42.56 Gy
Reference Point Dosage Given to Date: 34.58 Gy
Reference Point Session Dosage Given: 2.66 Gy
Session Number: 13

## 2022-09-07 ENCOUNTER — Ambulatory Visit
Admission: RE | Admit: 2022-09-07 | Discharge: 2022-09-07 | Disposition: A | Discharge status: Home / Self Care | Payer: BC Managed Care – PPO | Source: Ambulatory Visit | Attending: Radiation Oncology | Admitting: Radiation Oncology

## 2022-09-07 ENCOUNTER — Other Ambulatory Visit: Payer: Self-pay

## 2022-09-07 DIAGNOSIS — C50411 Malignant neoplasm of upper-outer quadrant of right female breast: Secondary | ICD-10-CM | POA: Diagnosis not present

## 2022-09-07 LAB — RAD ONC ARIA SESSION SUMMARY
Course Elapsed Days: 17
Plan Fractions Treated to Date: 14
Plan Prescribed Dose Per Fraction: 2.66 Gy
Plan Total Fractions Prescribed: 16
Plan Total Prescribed Dose: 42.56 Gy
Reference Point Dosage Given to Date: 37.24 Gy
Reference Point Session Dosage Given: 2.66 Gy
Session Number: 14

## 2022-09-08 ENCOUNTER — Ambulatory Visit
Admission: RE | Admit: 2022-09-08 | Discharge: 2022-09-08 | Disposition: A | Discharge status: Home / Self Care | Payer: BC Managed Care – PPO | Source: Ambulatory Visit | Attending: Radiation Oncology | Admitting: Radiation Oncology

## 2022-09-08 ENCOUNTER — Other Ambulatory Visit: Payer: Self-pay

## 2022-09-08 DIAGNOSIS — C50411 Malignant neoplasm of upper-outer quadrant of right female breast: Secondary | ICD-10-CM | POA: Diagnosis not present

## 2022-09-08 LAB — RAD ONC ARIA SESSION SUMMARY
Course Elapsed Days: 18
Plan Fractions Treated to Date: 15
Plan Prescribed Dose Per Fraction: 2.66 Gy
Plan Total Fractions Prescribed: 16
Plan Total Prescribed Dose: 42.56 Gy
Reference Point Dosage Given to Date: 39.9 Gy
Reference Point Session Dosage Given: 2.66 Gy
Session Number: 15

## 2022-09-10 NOTE — Assessment & Plan Note (Signed)
Stage IA, p(T1c, N0), ER+/PR+/HER2-, Grade 2  -Diagnosed in September 2023 by screening mammogram. -Status postlumpectomy and sentinel lymph node biopsy on July 14, 2022.  Surgical path reviewed with her. -Oncotype recurrence score 14, which predict 9-year distant risk of recurrence 4% with tamoxifen.  In the low risk disease, chemotherapy is not recommended. -He is on adjuvant radiation. --Given the strong ER and PR positivity, I do recommend adjuvant aromatase inhibitor to reduce her risk of cancer recurrence,  The potential benefit and side effects, which includes but not limited to, hot flash, skin and vaginal dryness, metabolic changes ( increased blood glucose, cholesterol, weight, etc.), slightly in increased risk of cardiovascular disease, cataracts, muscular and joint discomfort, osteopenia and osteoporosis, etc, were discussed with her in great details. She is interested, and we'll start after she completes radiation.  

## 2022-09-11 ENCOUNTER — Ambulatory Visit
Admission: RE | Admit: 2022-09-11 | Discharge: 2022-09-11 | Disposition: A | Discharge status: Home / Self Care | Payer: BC Managed Care – PPO | Source: Ambulatory Visit | Attending: Radiation Oncology | Admitting: Radiation Oncology

## 2022-09-11 ENCOUNTER — Encounter: Payer: Self-pay | Admitting: Hematology

## 2022-09-11 ENCOUNTER — Other Ambulatory Visit: Payer: Self-pay

## 2022-09-11 ENCOUNTER — Inpatient Hospital Stay: Payer: BC Managed Care – PPO | Attending: Hematology | Admitting: Hematology

## 2022-09-11 ENCOUNTER — Encounter: Payer: Self-pay | Admitting: *Deleted

## 2022-09-11 VITALS — BP 124/76 | HR 82 | Temp 98.4°F | Resp 18 | Ht 65.0 in | Wt 209.5 lb

## 2022-09-11 DIAGNOSIS — Z170 Estrogen receptor positive status [ER+]: Secondary | ICD-10-CM

## 2022-09-11 DIAGNOSIS — C50411 Malignant neoplasm of upper-outer quadrant of right female breast: Secondary | ICD-10-CM | POA: Diagnosis not present

## 2022-09-11 DIAGNOSIS — E2839 Other primary ovarian failure: Secondary | ICD-10-CM

## 2022-09-11 LAB — RAD ONC ARIA SESSION SUMMARY
Course Elapsed Days: 21
Plan Fractions Treated to Date: 16
Plan Prescribed Dose Per Fraction: 2.66 Gy
Plan Total Fractions Prescribed: 16
Plan Total Prescribed Dose: 42.56 Gy
Reference Point Dosage Given to Date: 42.56 Gy
Reference Point Session Dosage Given: 2.66 Gy
Session Number: 16

## 2022-09-11 NOTE — Progress Notes (Signed)
[place]   Telephone:(336) [phone] Fax:(336) [phone]   Clinic Follow up Note   Patient Care Team: Raina Mina., Janice Wagner as PCP - General (Internal Medicine) Mauro Kaufmann, RN as Oncology Nurse Navigator Rockwell Germany, RN as Oncology Nurse Navigator Coralie Keens, Janice Wagner as Consulting Physician (General Surgery) Truitt Merle, Janice Wagner as Consulting Physician (Hematology) Kyung Rudd, Janice Wagner as Consulting Physician (Radiation Oncology) Nehemiah Settle, Janice Wagner (Gastroenterology)  Date of Service:  09/11/2022  CHIEF COMPLAINT: f/u of Right Breast Cancer, ER+   CURRENT THERAPY:    ASSESSMENT:  Janice Wagner is a 61 y.o. female with   Malignant neoplasm of upper-outer quadrant of right breast in female, estrogen receptor positive ([place])  Stage IA, p(T1c, N0), ER+/PR+/HER2-, Grade 2  -Diagnosed in September 2023 by screening mammogram. -Status postlumpectomy and sentinel lymph node biopsy on July 14, 2022.  Surgical path reviewed with her. -Oncotype recurrence score 14, which predict 9-year distant risk of recurrence 4% with tamoxifen.  In the low risk disease, chemotherapy is not recommended. -He is on adjuvant radiation. --Given the strong ER and PR positivity, I do recommend adjuvant aromatase inhibitor to reduce her risk of cancer recurrence,  The potential benefit and side effects, which includes but not limited to, hot flash, skin and vaginal dryness, metabolic changes ( increased blood glucose, cholesterol, weight, etc.), slightly in increased risk of cardiovascular disease, cataracts, muscular and joint discomfort, osteopenia and osteoporosis, etc, were discussed with her in great details.  -We also discussed the alternative of tamoxifen, due to her baseline arthritis and history of hysterectomy, tamoxifen may be good option.  We reviewed the risk of thrombosis from tamoxifen and management strategies. -Patient will think about AI exemestane versus tamoxifen, and proceed  with bone density scan next months.  She will call me back in January with her decision.     PLAN: - Discuss surgical path  -Discuss Oncotype report -Discuss Side effect for antiestrogen therapy, I recommend tamoxifen or exemestane -order Bone Density to be done in Jan  -She will call me back in January with her decision on which antiestrogen therapy to start. -Survivorship in 3 months, lab and follow-up with me in 6 months.    SUMMARY OF ONCOLOGIC HISTORY: Oncology History  Malignant neoplasm of upper-outer quadrant of right breast in female, estrogen receptor positive ([place])  06/19/2022 Initial Biopsy   Diagnosis 1. Breast, right, needle core biopsy, 12 o'clock, 2cmfn INVASIVE MODERATELY DIFFERENTIATED DUCTAL ADENOCARCINOMA, GRADE 2 (3+2+1) MICROCALCIFICATIONS PRESENT NEGATIVE FOR LYMPHOVASCULAR INVASION TUMOR MEASURES 6 MM IN GREATEST LINEAR EXTENT 2. Breast, right, needle core biopsy, 9:30 o'clock, 6cmfn BENIGN BREAST WITH FIBROCYSTIC CHANGES INCLUDING STROMAL FIBROSIS AND USUAL DUCT HYPERPLASIA PSEUDOANGIOMATOUS STROMAL HYPERPLASIA (PASH) NEGATIVE FOR MICROCALCIFICATIONS NEGATIVE FOR CARCINOMA 3. Breast, right, needle core biopsy, 10 o'clock, 8cmfn COMPATIBLE WITH A BENIGN FIBROADENOMA RARE MICROCALCIFICATIONS PRESENT NEGATIVE FOR MALIGNANCY Diagnosis Note 1. An immunohistochemical stain for E-cadherin is performed with adequate control and is positive within the tumor supporting a ductal differentiation.  1. PROGNOSTIC INDICATORS Results: The tumor cells are equivocal for Her2 (2+). Her2 by FISH will be performed and the results reported separately. Estrogen Receptor: 100%, POSITIVE, STRONG STAINING INTENSITY Progesterone Receptor: 90%, POSITIVE, STRONG STAINING INTENSITY Proliferation Marker Ki67: 10%  1. FLUORESCENCE IN-SITU HYBRIDIZATION Results: GROUP 5: HER2 **NEGATIVE**   06/26/2022 Initial Diagnosis   Malignant neoplasm of upper-outer quadrant of right  breast in female, estrogen receptor positive ([place])   06/26/2022 Cancer Staging   Staging form: Breast, AJCC  8th Edition - Clinical: Stage IA (cT1c, cN0, cM0, G2, ER+, PR+, HER2-) - Signed by Hayden Pedro, PA-C on 06/26/2022 Stage prefix: Initial diagnosis Method of lymph node assessment: Clinical Histologic grading system: 3 grade system   07/07/2022 Genetic Testing   Negative hereditary cancer genetic testing: no pathogenic variants detected in [place] Expanded +RNAinsight Panel.  VUS in PTCH1 at p.P725R (c.2174C>G).   Report date is 07/07/2022.   The CancerNext-Expanded gene panel offered by [facility] and includes sequencing, rearrangement, and RNA analysis for the following 77 genes: AIP, ALK, APC, ATM, AXIN2, BAP1, BARD1, BLM, BMPR1A, BRCA1, BRCA2, BRIP1, CDC73, CDH1, CDK4, CDKN1B, CDKN2A, CHEK2, CTNNA1, DICER1, FANCC, FH, FLCN, GALNT12, KIF1B, LZTR1, MAX, MEN1, MET, MLH1, MSH2, MSH3, MSH6, MUTYH, NBN, NF1, NF2, NTHL1, PALB2, PHOX2B, PMS2, POT1, PRKAR1A, PTCH1, PTEN, RAD51C, RAD51D, RB1, RECQL, RET, SDHA, SDHAF2, SDHB, SDHC, SDHD, SMAD4, SMARCA4, SMARCB1, SMARCE1, STK11, SUFU, TMEM127, TP53, TSC1, TSC2, VHL and XRCC2 (sequencing and deletion/duplication); EGFR, EGLN1, HOXB13, KIT, MITF, PDGFRA, POLD1, and POLE (sequencing only); EPCAM and GREM1 (deletion/duplication only).    07/14/2022 Cancer Staging   Staging form: Breast, AJCC 8th Edition - Pathologic stage from 07/14/2022: Stage IA (pT1c, pN0, cM0, G2, ER+, PR+, HER2-) - Signed by Truitt Merle, Janice Wagner on 09/10/2022 Stage prefix: Initial diagnosis Histologic grading system: 3 grade system Residual tumor (R): R0 - None      INTERVAL HISTORY:  Janice Wagner is here for a follow up of  Right Breast Cancer, ER+  She was last seen by me on 06/28/22 She presents to the clinic accompanied by son and husband.Pt state surgery went well and radiation was going well.  All other systems were reviewed with the patient and are  negative.  MEDICAL HISTORY:  Past Medical History:  Diagnosis Date   Arthritis    Empty sella syndrome ([place])    Family history of breast cancer 06/28/2022   Family history of colon cancer 06/28/2022   GERD (gastroesophageal reflux disease)    H/O: vasectomy    husband with vasectomy   Hypertension    Insulin resistance    Migraines    Ovarian cyst    Reflux    Sleep apnea    has used CPAP, not currently    SURGICAL HISTORY: Past Surgical History:  Procedure Laterality Date   ABDOMINAL SURGERY     Laparotomy-Ovarian cyst   BREAST LUMPECTOMY WITH RADIOACTIVE SEED AND SENTINEL LYMPH NODE BIOPSY Right 07/14/2022   Procedure: RIGHT BREAST LUMPECTOMY WITH RADIOACTIVE SEED AND SENTINEL LYMPH NODE BIOPSY;  Surgeon: Coralie Keens, Janice Wagner;  Location: [facility];  Service: General;  Laterality: Right;   [place]  11/2002   VAGINAL HYST., POSTERIOR REPAIR    I have reviewed the social history and family history with the patient and they are unchanged from previous note.  ALLERGIES:  is allergic to penicillins.  MEDICATIONS:  Current Outpatient Medications  Medication Sig Dispense Refill   amLODipine (NORVASC) 5 MG tablet Take 5 mg by mouth daily.     celecoxib (CELEBREX) 200 MG capsule Take 200 mg by mouth daily.     Cetirizine HCl (ZYRTEC ALLERGY PO) Take by mouth.     DULoxetine (CYMBALTA) 30 MG capsule Take 30 mg by mouth daily.     famotidine (PEPCID) 40 MG tablet Take 40 mg by mouth 2 (two) times daily.     lisinopril (ZESTRIL) 20 MG tablet Take 20  mg by mouth daily.     metFORMIN (GLUCOPHAGE-XR) 500 MG 24 hr tablet Take 500 mg by mouth 2 (two) times daily.     ondansetron (ZOFRAN) 4 MG tablet Take 1 tablet (4 mg total) by mouth every 8 (eight) hours as needed for nausea or vomiting. 20 tablet 0   ondansetron (ZOFRAN-ODT) 4 MG disintegrating tablet Take 4 mg by mouth every 8 (eight) hours as needed for nausea or  vomiting.     topiramate (TOPAMAX) 25 MG tablet Take 25 mg by mouth daily.     traMADol (ULTRAM) 50 MG tablet Take 1 tablet (50 mg total) by mouth every 6 (six) hours as needed for moderate pain or severe pain. 25 tablet 0   No current facility-administered medications for this visit.    PHYSICAL EXAMINATION: ECOG PERFORMANCE STATUS: 0 - Asymptomatic  Vitals:   09/11/22 1345  BP: 124/76  Pulse: 82  Resp: 18  Temp: 98.4 F (36.9 C)  SpO2: 99%   Wt Readings from Last 3 Encounters:  09/11/22 209 lb 8 oz (95 kg)  08/08/22 212 lb 2 oz (96.2 kg)  07/14/22 212 lb 4.9 oz (96.3 kg)    GENERAL:alert, no distress and comfortable SKIN: skin color, texture, turgor are normal, no rashes or significant lesions EYES: normal, Conjunctiva are pink and non-injected, sclera clear NECK: supple, thyroid normal size, non-tender, without nodularity LYMPH:  no palpable lymphadenopathy in the cervical, axillary  LUNGS: clear to auscultation and percussion with normal breathing effort HEART: regular rate & rhythm and no murmurs and no lower extremity edema ABDOMEN:abdomen soft, non-tender and normal bowel sounds Musculoskeletal:no cyanosis of digits and no clubbing  NEURO: alert & oriented x 3 with fluent speech, no focal motor/sensory deficits BREAST: no papable mass. Breast exam benign LABORATORY DATA:  I have reviewed the data as listed    Latest Ref Rng & Units 06/28/2022   11:52 AM  CBC  WBC 4.0 - 10.5 K/uL 9.7   Hemoglobin 12.0 - 15.0 g/dL 12.6   Hematocrit 36.0 - 46.0 % 39.6   Platelets 150 - 400 K/uL 386         Latest Ref Rng & Units 06/28/2022   11:52 AM  CMP  Glucose 70 - 99 mg/dL 137   BUN 6 - 20 mg/dL 13   Creatinine 0.44 - 1.00 mg/dL 1.15   Sodium 135 - 145 mmol/L 139   Potassium 3.5 - 5.1 mmol/L 3.9   Chloride 98 - 111 mmol/L 106   CO2 22 - 32 mmol/L 26   Calcium 8.9 - 10.3 mg/dL 9.1   Total Protein 6.5 - 8.1 g/dL 6.7   Total Bilirubin 0.3 - 1.2 mg/dL 0.7   Alkaline  Phos 38 - 126 U/L 104   AST 15 - 41 U/L 18   ALT 0 - 44 U/L 15       RADIOGRAPHIC STUDIES: I have personally reviewed the radiological images as listed and agreed with the findings in the report. No results found.    Orders Placed This Encounter  Procedures   DG Bone Density    Standing Status:   Future    Standing Expiration Date:   09/11/2023    Scheduling Instructions:     [facility]    Order Specific Question:   Reason for Exam (SYMPTOM  OR DIAGNOSIS REQUIRED)    Answer:   screening    Order Specific Question:   Is the patient pregnant?    Answer:   No  Order Specific Question:   Preferred imaging location?    Answer:   External   All questions were answered. The patient knows to call the clinic with any problems, questions or concerns. No barriers to learning was detected. The total time spent in the appointment was 40 minutes.     Truitt Merle, Janice Wagner 09/11/2022   Felicity Coyer, CMA, am acting as scribe for Truitt Merle, Janice Wagner.   I have reviewed the above documentation for accuracy and completeness, and I agree with the above.

## 2022-09-11 NOTE — Progress Notes (Signed)
                                                                                                                                                            Patient Name: Janice Wagner MRN: [id number] DOB: 09-15-1961 Referring Physician: Gilford Rile (Profile Not Attached) Date of Service: 09/15/2022 [facility], [place]                                                        End Of Treatment Note  Diagnoses: C50.411-Malignant neoplasm of upper-outer quadrant of right female breast  Cancer Staging:  Stage IA, cT1cN0M0, grade 2, ER/PR positive invasive ductal carcinoma of the right breast.   Intent: Curative  Radiation Treatment Dates: 08/21/2022 through 09/15/2022 Site Technique Total Dose (Gy) Dose per Fx (Gy) Completed Fx Beam Energies  Breast, Right: Breast_R 3D 42.56/42.56 2.66 16/16 10X  Breast, Right: Breast_R_Bst specialPort 8/8 2 4/4 15E   Narrative: The patient tolerated radiation therapy relatively well. She developed fatigue and anticipated skin changes in the treatment field.   Plan: The patient will receive a call in about one month from the radiation oncology department. She will continue follow up with Dr. Burr Medico as well.   ________________________________________________    Carola Rhine, [facility]

## 2022-09-12 ENCOUNTER — Ambulatory Visit
Admission: RE | Admit: 2022-09-12 | Discharge: 2022-09-12 | Disposition: A | Discharge status: Home / Self Care | Payer: BC Managed Care – PPO | Source: Ambulatory Visit | Attending: Radiation Oncology | Admitting: Radiation Oncology

## 2022-09-12 ENCOUNTER — Other Ambulatory Visit: Payer: Self-pay

## 2022-09-12 DIAGNOSIS — C50411 Malignant neoplasm of upper-outer quadrant of right female breast: Secondary | ICD-10-CM | POA: Diagnosis not present

## 2022-09-12 LAB — RAD ONC ARIA SESSION SUMMARY
Course Elapsed Days: 22
Plan Fractions Treated to Date: 1
Plan Prescribed Dose Per Fraction: 2 Gy
Plan Total Fractions Prescribed: 4
Plan Total Prescribed Dose: 8 Gy
Reference Point Dosage Given to Date: 2 Gy
Reference Point Session Dosage Given: 2 Gy
Session Number: 17

## 2022-09-13 ENCOUNTER — Other Ambulatory Visit: Payer: Self-pay

## 2022-09-13 ENCOUNTER — Ambulatory Visit
Admission: RE | Admit: 2022-09-13 | Discharge: 2022-09-13 | Disposition: A | Discharge status: Home / Self Care | Payer: BC Managed Care – PPO | Source: Ambulatory Visit | Attending: Radiation Oncology | Admitting: Radiation Oncology

## 2022-09-13 DIAGNOSIS — C50411 Malignant neoplasm of upper-outer quadrant of right female breast: Secondary | ICD-10-CM | POA: Diagnosis not present

## 2022-09-13 LAB — RAD ONC ARIA SESSION SUMMARY
Course Elapsed Days: 23
Plan Fractions Treated to Date: 2
Plan Prescribed Dose Per Fraction: 2 Gy
Plan Total Fractions Prescribed: 4
Plan Total Prescribed Dose: 8 Gy
Reference Point Dosage Given to Date: 4 Gy
Reference Point Session Dosage Given: 2 Gy
Session Number: 18

## 2022-09-14 ENCOUNTER — Other Ambulatory Visit: Payer: Self-pay

## 2022-09-14 ENCOUNTER — Ambulatory Visit
Admission: RE | Admit: 2022-09-14 | Discharge: 2022-09-14 | Disposition: A | Discharge status: Home / Self Care | Payer: BC Managed Care – PPO | Source: Ambulatory Visit | Attending: Radiation Oncology | Admitting: Radiation Oncology

## 2022-09-14 DIAGNOSIS — C50411 Malignant neoplasm of upper-outer quadrant of right female breast: Secondary | ICD-10-CM | POA: Diagnosis not present

## 2022-09-14 LAB — RAD ONC ARIA SESSION SUMMARY
Course Elapsed Days: 24
Plan Fractions Treated to Date: 3
Plan Prescribed Dose Per Fraction: 2 Gy
Plan Total Fractions Prescribed: 4
Plan Total Prescribed Dose: 8 Gy
Reference Point Dosage Given to Date: 6 Gy
Reference Point Session Dosage Given: 2 Gy
Session Number: 19

## 2022-09-15 ENCOUNTER — Ambulatory Visit
Admission: RE | Admit: 2022-09-15 | Discharge: 2022-09-15 | Disposition: A | Discharge status: Home / Self Care | Payer: BC Managed Care – PPO | Source: Ambulatory Visit | Attending: Radiation Oncology | Admitting: Radiation Oncology

## 2022-09-15 ENCOUNTER — Encounter: Payer: Self-pay | Admitting: Hematology

## 2022-09-15 ENCOUNTER — Other Ambulatory Visit: Payer: Self-pay

## 2022-09-15 ENCOUNTER — Encounter: Payer: Self-pay | Admitting: Radiation Oncology

## 2022-09-15 DIAGNOSIS — Z170 Estrogen receptor positive status [ER+]: Secondary | ICD-10-CM

## 2022-09-15 DIAGNOSIS — C50411 Malignant neoplasm of upper-outer quadrant of right female breast: Secondary | ICD-10-CM | POA: Diagnosis not present

## 2022-09-15 LAB — RAD ONC ARIA SESSION SUMMARY
Course Elapsed Days: 25
Plan Fractions Treated to Date: 4
Plan Prescribed Dose Per Fraction: 2 Gy
Plan Total Fractions Prescribed: 4
Plan Total Prescribed Dose: 8 Gy
Reference Point Dosage Given to Date: 8 Gy
Reference Point Session Dosage Given: 2 Gy
Session Number: 20

## 2022-09-15 MED ORDER — RADIAPLEXRX EX GEL: Freq: Once | CUTANEOUS | Status: AC

## 2022-09-19 ENCOUNTER — Encounter: Payer: Self-pay | Admitting: Hematology

## 2022-09-19 ENCOUNTER — Other Ambulatory Visit: Payer: Self-pay | Admitting: Nurse Practitioner

## 2022-09-19 MED ORDER — EXEMESTANE 25 MG PO TABS: 25.0000 mg | ORAL_TABLET | Freq: Every day | ORAL | 0 refills | Status: DC

## 2022-10-12 ENCOUNTER — Encounter (HOSPITAL_COMMUNITY): Payer: Self-pay

## 2022-10-30 ENCOUNTER — Telehealth: Payer: Self-pay | Admitting: Hematology

## 2022-10-30 ENCOUNTER — Ambulatory Visit: Payer: BC Managed Care – PPO | Admitting: Rehabilitation

## 2022-10-30 ENCOUNTER — Ambulatory Visit
Admission: RE | Admit: 2022-10-30 | Discharge: 2022-10-30 | Disposition: A | Discharge status: Home / Self Care | Payer: BC Managed Care – PPO | Source: Ambulatory Visit | Attending: Hematology | Admitting: Hematology

## 2022-10-30 DIAGNOSIS — Z90710 Acquired absence of both cervix and uterus: Secondary | ICD-10-CM | POA: Insufficient documentation

## 2022-10-30 DIAGNOSIS — Z803 Family history of malignant neoplasm of breast: Secondary | ICD-10-CM | POA: Insufficient documentation

## 2022-10-30 DIAGNOSIS — Z800 Family history of malignant neoplasm of digestive organs: Secondary | ICD-10-CM | POA: Insufficient documentation

## 2022-10-30 DIAGNOSIS — C50411 Malignant neoplasm of upper-outer quadrant of right female breast: Secondary | ICD-10-CM | POA: Insufficient documentation

## 2022-10-30 DIAGNOSIS — Z170 Estrogen receptor positive status [ER+]: Secondary | ICD-10-CM | POA: Insufficient documentation

## 2022-10-30 DIAGNOSIS — I10 Essential (primary) hypertension: Secondary | ICD-10-CM | POA: Insufficient documentation

## 2022-10-30 NOTE — Telephone Encounter (Signed)
Contacted patient to scheduled appointments. Left message with appointment details and a call back number if patient had any questions or could not accommodate the time we provided.   

## 2022-10-30 NOTE — Progress Notes (Addendum)
  Radiation Oncology         (336) [phone] ________________________________  Name: Janice Wagner MRN: [id number]  Date of Service: 10/30/2022  DOB: 1961/03/13  Post Treatment Telephone Note  Diagnosis:  Stage IA, cT1cN0M0, grade 2, ER/PR positive invasive ductal carcinoma of the right breast.   Intent: Curative  Radiation Treatment Dates: 08/21/2022 through 09/15/2022 Site Technique Total Dose (Gy) Dose per Fx (Gy) Completed Fx Beam Energies  Breast, Right: Breast_R 3D 42.56/42.56 2.66 16/16 10X  Breast, Right: Breast_R_Bst specialPort 8/8 2 4/4 15E   (as documented in provider EOT note)  The patient was available for call today.   Symptoms of fatigue have improved since completing therapy.  Symptoms of skin changes have improved since completing therapy.  The patient was encouraged to avoid sun exposure in the area of prior treatment for up to one year following radiation with either sunscreen or by the style of clothing worn in the sun.  The patient has NOT scheduled a follow up with her medical oncologist Dr. Burr Medico for ongoing surveillance, and was encouraged to call if she develops concerns or questions regarding radiation. Dr. Burr Medico has been notified of the need for follow-up.  This concludes the interview.   Leandra Kern, LPN

## 2022-10-31 NOTE — Progress Notes (Deleted)
[place]   Telephone:([phone] Fax:(336) [phone]   Clinic Follow up Note   Patient Care Team: Raina Mina., MD as PCP - General (Internal Medicine) Mauro Kaufmann, RN as Oncology Nurse Navigator Rockwell Germany, RN as Oncology Nurse Navigator Coralie Keens, MD as Consulting Physician (General Surgery) Truitt Merle, MD as Consulting Physician (Hematology) Kyung Rudd, MD as Consulting Physician (Radiation Oncology) Nehemiah Settle, MD (Gastroenterology)  Date of Service:  10/31/2022  CHIEF COMPLAINT: f/u of Right Breast Cancer, ER+   CURRENT THERAPY:  Exemestane started 08/2022  ASSESSMENT: *** Janice Wagner is a 62 y.o. female with   No problem-specific Assessment & Plan notes found for this encounter.  ***   PLAN:    SUMMARY OF ONCOLOGIC HISTORY: Oncology History  Malignant neoplasm of upper-outer quadrant of right breast in female, estrogen receptor positive ([place])  06/19/2022 Initial Biopsy   Diagnosis 1. Breast, right, needle core biopsy, 12 o'clock, 2cmfn INVASIVE MODERATELY DIFFERENTIATED DUCTAL ADENOCARCINOMA, GRADE 2 (3+2+1) MICROCALCIFICATIONS PRESENT NEGATIVE FOR LYMPHOVASCULAR INVASION TUMOR MEASURES 6 MM IN GREATEST LINEAR EXTENT 2. Breast, right, needle core biopsy, 9:30 o'clock, 6cmfn BENIGN BREAST WITH FIBROCYSTIC CHANGES INCLUDING STROMAL FIBROSIS AND USUAL DUCT HYPERPLASIA PSEUDOANGIOMATOUS STROMAL HYPERPLASIA (PASH) NEGATIVE FOR MICROCALCIFICATIONS NEGATIVE FOR CARCINOMA 3. Breast, right, needle core biopsy, 10 o'clock, 8cmfn COMPATIBLE WITH A BENIGN FIBROADENOMA RARE MICROCALCIFICATIONS PRESENT NEGATIVE FOR MALIGNANCY Diagnosis Note 1. An immunohistochemical stain for E-cadherin is performed with adequate control and is positive within the tumor supporting a ductal differentiation.  1. PROGNOSTIC INDICATORS Results: The tumor cells are equivocal for Her2 (2+). Her2 by FISH will be performed and the results  reported separately. Estrogen Receptor: 100%, POSITIVE, STRONG STAINING INTENSITY Progesterone Receptor: 90%, POSITIVE, STRONG STAINING INTENSITY Proliferation Marker Ki67: 10%  1. FLUORESCENCE IN-SITU HYBRIDIZATION Results: GROUP 5: HER2 **NEGATIVE**   06/26/2022 Initial Diagnosis   Malignant neoplasm of upper-outer quadrant of right breast in female, estrogen receptor positive ([place])   06/26/2022 Cancer Staging   Staging form: Breast, AJCC 8th Edition - Clinical: Stage IA (cT1c, cN0, cM0, G2, ER+, PR+, HER2-) - Signed by Hayden Pedro, PA-C on 06/26/2022 Stage prefix: Initial diagnosis Method of lymph node assessment: Clinical Histologic grading system: 3 grade system   07/07/2022 Genetic Testing   Negative hereditary cancer genetic testing: no pathogenic variants detected in [place] Expanded +RNAinsight Panel.  VUS in PTCH1 at p.P725R (c.2174C>G).   Report date is 07/07/2022.   The CancerNext-Expanded gene panel offered by [facility] and includes sequencing, rearrangement, and RNA analysis for the following 77 genes: AIP, ALK, APC, ATM, AXIN2, BAP1, BARD1, BLM, BMPR1A, BRCA1, BRCA2, BRIP1, CDC73, CDH1, CDK4, CDKN1B, CDKN2A, CHEK2, CTNNA1, DICER1, FANCC, FH, FLCN, GALNT12, KIF1B, LZTR1, MAX, MEN1, MET, MLH1, MSH2, MSH3, MSH6, MUTYH, NBN, NF1, NF2, NTHL1, PALB2, PHOX2B, PMS2, POT1, PRKAR1A, PTCH1, PTEN, RAD51C, RAD51D, RB1, RECQL, RET, SDHA, SDHAF2, SDHB, SDHC, SDHD, SMAD4, SMARCA4, SMARCB1, SMARCE1, STK11, SUFU, TMEM127, TP53, TSC1, TSC2, VHL and XRCC2 (sequencing and deletion/duplication); EGFR, EGLN1, HOXB13, KIT, MITF, PDGFRA, POLD1, and POLE (sequencing only); EPCAM and GREM1 (deletion/duplication only).    07/14/2022 Cancer Staging   Staging form: Breast, AJCC 8th Edition - Pathologic stage from 07/14/2022: Stage IA (pT1c, pN0, cM0, G2, ER+, PR+, HER2-) - Signed by Truitt Merle, MD on 09/10/2022 Stage prefix: Initial diagnosis Histologic grading system: 3 grade  system Residual tumor (R): R0 - None    Imaging      Oncotype testing  INTERVAL HISTORY: *** [name] is here for a follow up of  Right Breast Cancer, ER+ She was last seen by me on 09/12/2023 She presents to the clinic      All other systems were reviewed with the patient and are negative.  MEDICAL HISTORY:  Past Medical History:  Diagnosis Date   Arthritis    Empty sella syndrome ([place])    Family history of breast cancer 06/28/2022   Family history of colon cancer 06/28/2022   GERD (gastroesophageal reflux disease)    H/O: vasectomy    husband with vasectomy   Hypertension    Insulin resistance    Migraines    Ovarian cyst    Reflux    Sleep apnea    has used CPAP, not currently    SURGICAL HISTORY: Past Surgical History:  Procedure Laterality Date   ABDOMINAL SURGERY     Laparotomy-Ovarian cyst   BREAST LUMPECTOMY WITH RADIOACTIVE SEED AND SENTINEL LYMPH NODE BIOPSY Right 07/14/2022   Procedure: RIGHT BREAST LUMPECTOMY WITH RADIOACTIVE SEED AND SENTINEL LYMPH NODE BIOPSY;  Surgeon: Coralie Keens, MD;  Location: [facility];  Service: General;  Laterality: Right;   [place]  11/2002   VAGINAL HYST., POSTERIOR REPAIR    I have reviewed the social history and family history with the patient and they are unchanged from previous note.  ALLERGIES:  is allergic to penicillins.  MEDICATIONS:  Current Outpatient Medications  Medication Sig Dispense Refill   amLODipine (NORVASC) 5 MG tablet Take 5 mg by mouth daily.     celecoxib (CELEBREX) 200 MG capsule Take 200 mg by mouth daily.     Cetirizine HCl (ZYRTEC ALLERGY PO) Take by mouth.     DULoxetine (CYMBALTA) 30 MG capsule Take 30 mg by mouth daily.     exemestane (AROMASIN) 25 MG tablet Take 1 tablet (25 mg total) by mouth daily after breakfast. 90 tablet 0   famotidine (PEPCID) 40 MG tablet Take 40 mg by mouth 2 (two) times daily.      lisinopril (ZESTRIL) 20 MG tablet Take 20 mg by mouth daily.     metFORMIN (GLUCOPHAGE-XR) 500 MG 24 hr tablet Take 500 mg by mouth 2 (two) times daily.     ondansetron (ZOFRAN) 4 MG tablet Take 1 tablet (4 mg total) by mouth every 8 (eight) hours as needed for nausea or vomiting. 20 tablet 0   ondansetron (ZOFRAN-ODT) 4 MG disintegrating tablet Take 4 mg by mouth every 8 (eight) hours as needed for nausea or vomiting.     topiramate (TOPAMAX) 25 MG tablet Take 25 mg by mouth daily.     traMADol (ULTRAM) 50 MG tablet Take 1 tablet (50 mg total) by mouth every 6 (six) hours as needed for moderate pain or severe pain. 25 tablet 0   No current facility-administered medications for this visit.    PHYSICAL EXAMINATION: ECOG PERFORMANCE STATUS: {CHL ONC ECOG PS:[id number]}  There were no vitals filed for this visit. Wt Readings from Last 3 Encounters:  09/11/22 209 lb 8 oz (95 kg)  08/08/22 212 lb 2 oz (96.2 kg)  07/14/22 212 lb 4.9 oz (96.3 kg)    {Only keep what was examined. If exam not performed, can use .CEXAM } GENERAL:alert, no distress and comfortable SKIN: skin color, texture, turgor are normal, no rashes or significant lesions EYES: normal, Conjunctiva are pink and non-injected, sclera clear {OROPHARYNX:no exudate, no  erythema and lips, buccal mucosa, and tongue normal}  NECK: supple, thyroid normal size, non-tender, without nodularity LYMPH:  no palpable lymphadenopathy in the cervical, axillary {or inguinal} LUNGS: clear to auscultation and percussion with normal breathing effort HEART: regular rate & rhythm and no murmurs and no lower extremity edema ABDOMEN:abdomen soft, non-tender and normal bowel sounds Musculoskeletal:no cyanosis of digits and no clubbing  NEURO: alert & oriented x 3 with fluent speech, no focal motor/sensory deficits  LABORATORY DATA:  I have reviewed the data as listed    Latest Ref Rng & Units 06/28/2022   11:52 AM  CBC  WBC 4.0 - 10.5 K/uL 9.7    Hemoglobin 12.0 - 15.0 g/dL 12.6   Hematocrit 36.0 - 46.0 % 39.6   Platelets 150 - 400 K/uL 386         Latest Ref Rng & Units 06/28/2022   11:52 AM  CMP  Glucose 70 - 99 mg/dL 137   BUN 6 - 20 mg/dL 13   Creatinine 0.44 - 1.00 mg/dL 1.15   Sodium 135 - 145 mmol/L 139   Potassium 3.5 - 5.1 mmol/L 3.9   Chloride 98 - 111 mmol/L 106   CO2 22 - 32 mmol/L 26   Calcium 8.9 - 10.3 mg/dL 9.1   Total Protein 6.5 - 8.1 g/dL 6.7   Total Bilirubin 0.3 - 1.2 mg/dL 0.7   Alkaline Phos 38 - 126 U/L 104   AST 15 - 41 U/L 18   ALT 0 - 44 U/L 15       RADIOGRAPHIC STUDIES: I have personally reviewed the radiological images as listed and agreed with the findings in the report. No results found.    No orders of the defined types were placed in this encounter.  All questions were answered. The patient knows to call the clinic with any problems, questions or concerns. No barriers to learning was detected. The total time spent in the appointment was {CHL ONC TIME VISIT - [id number]}.     Baldemar Friday, CMA 10/31/2022   I, Audry Riles, CMA, am acting as scribe for Truitt Merle, MD.   {Add scribe attestation statement}

## 2022-11-01 ENCOUNTER — Inpatient Hospital Stay: Payer: BC Managed Care – PPO | Admitting: Hematology

## 2022-11-06 ENCOUNTER — Ambulatory Visit: Payer: BC Managed Care – PPO | Attending: Surgery

## 2022-11-06 VITALS — Wt 206.2 lb

## 2022-11-06 DIAGNOSIS — Z483 Aftercare following surgery for neoplasm: Secondary | ICD-10-CM

## 2022-11-06 NOTE — Therapy (Signed)
OUTPATIENT PHYSICAL THERAPY SOZO SCREENING NOTE   Patient Name: Janice Wagner MRN: [id number] DOB:1961/05/17, 62 y.o., female Today's Date: 11/06/2022  PCP: Raina Mina., MD REFERRING PROVIDER: Coralie Keens, MD   PT End of Session - 11/06/22 1025     Visit Number 2   # unchanged due to screen only   PT Start Time 86    PT Stop Time 1027    PT Time Calculation (min) 4 min    Activity Tolerance Patient tolerated treatment well    Behavior During Therapy WFL for tasks assessed/performed             Past Medical History:  Diagnosis Date   Arthritis    Empty sella syndrome ([place])    Family history of breast cancer 06/28/2022   Family history of colon cancer 06/28/2022   GERD (gastroesophageal reflux disease)    H/O: vasectomy    husband with vasectomy   Hypertension    Insulin resistance    Migraines    Ovarian cyst    Reflux    Sleep apnea    has used CPAP, not currently   Past Surgical History:  Procedure Laterality Date   ABDOMINAL SURGERY     Laparotomy-Ovarian cyst   BREAST LUMPECTOMY WITH RADIOACTIVE SEED AND SENTINEL LYMPH NODE BIOPSY Right 07/14/2022   Procedure: RIGHT BREAST LUMPECTOMY WITH RADIOACTIVE SEED AND SENTINEL LYMPH NODE BIOPSY;  Surgeon: Coralie Keens, MD;  Location: [facility];  Service: General;  Laterality: Right;   [place]  11/2002   VAGINAL HYST., POSTERIOR REPAIR   Patient Active Problem List   Diagnosis Date Noted   Genetic testing 07/04/2022   Family history of breast cancer 06/28/2022   Family history of colon cancer 06/28/2022   Malignant neoplasm of upper-outer quadrant of right breast in female, estrogen receptor positive ([place]) 06/26/2022   Ovarian cyst    Reflux    Arthritis    Insulin resistance    Empty sella syndrome ([place])    H/O: vasectomy     REFERRING DIAG: right breast cancer at risk for lymphedema  THERAPY DIAG:  Aftercare following  surgery for neoplasm  PERTINENT HISTORY: Patient was diagnosed on 06/07/2022 with right grade 2 invasive ductal carcinoma breast cancer. It measures 1.8 cm and is located in the upper outer quadrant. It is ER/PR positive and HER negative with a Ki67 of 10%. She had a right lumpectomy with SLNB on 07/14/2022. Her Oncotype is 14. She will have her radiation simulation today and will have 4 weeks of radiation.   PRECAUTIONS: right UE Lymphedema risk, None  SUBJECTIVE: Pt returns for her 3 month L-Dex screen.   PAIN:  Are you having pain? No  SOZO SCREENING: Patient was assessed today using the SOZO machine to determine the lymphedema index score. This was compared to her baseline score. It was determined that she is within the recommended range when compared to her baseline and no further action is needed at this time. She will continue SOZO screenings. These are done every 3 months for 2 years post operatively followed by every 6 months for 2 years, and then annually.   L-DEX FLOWSHEETS - 11/06/22 1000       L-DEX LYMPHEDEMA SCREENING   Measurement Type Unilateral    L-DEX MEASUREMENT EXTREMITY Upper Extremity    POSITION  Standing    DOMINANT SIDE Right    At Risk Side Right  BASELINE SCORE (UNILATERAL) 5.1    L-DEX SCORE (UNILATERAL) 2.4    VALUE CHANGE (UNILAT) -2.7              Otelia Limes, PTA 11/06/2022, 10:30 AM

## 2022-11-09 NOTE — Progress Notes (Signed)
[place]   Telephone:([phone] Fax:(336) [phone]   Clinic Follow up Note   Patient Care Team: Raina Mina., MD as PCP - General (Internal Medicine) Mauro Kaufmann, RN as Oncology Nurse Navigator Rockwell Germany, RN as Oncology Nurse Navigator Coralie Keens, MD as Consulting Physician (General Surgery) Truitt Merle, MD as Consulting Physician (Hematology) Kyung Rudd, MD as Consulting Physician (Radiation Oncology) Nehemiah Settle, MD (Gastroenterology)  Date of Service:  11/10/2022  CHIEF COMPLAINT: f/u of Right Breast Cancer, ER+   CURRENT THERAPY:  exemestane started 09/2022  ASSESSMENT:  Janice Wagner is a 62 y.o. female with   Malignant neoplasm of upper-outer quadrant of right breast in female, estrogen receptor positive ([place]) Stage IA, p(T1c, N0), ER+/PR+/HER2-, Grade 2  -Diagnosed in September 2023 by screening mammogram. -Status postlumpectomy and sentinel lymph node biopsy on July 14, 2022.  Surgical path reviewed with her. -Oncotype recurrence score 14, which predict 9-year distant risk of recurrence 4% with tamoxifen.  In the low risk disease, chemotherapy is not recommended. -She completed adjuvant radiation on 09/15/2022 -She started exemestane in January 2024, tolerating well so far. -Continue breast cancer surveillance.  She is clinically doing well, lab reviewed, exam was unremarkable, no clinical concern for recurrence.   PLAN: -Mammogram- 05/2023 -labs drawn today -I refill Exemestane -Request  Bone Density Scan from [facility] -Discuss Survivorship in 3 months -lab,f/u in 6 months   SUMMARY OF ONCOLOGIC HISTORY: Oncology History Overview Note   Cancer Staging  Malignant neoplasm of upper-outer quadrant of right breast in female, estrogen receptor positive ([place]) Staging form: Breast, AJCC 8th Edition - Clinical: Stage IA (cT1c, cN0, cM0, G2, ER+, PR+, HER2-) - Signed by Hayden Pedro, PA-C on 06/26/2022 Stage  prefix: Initial diagnosis Method of lymph node assessment: Clinical Histologic grading system: 3 grade system - Pathologic stage from 07/14/2022: Stage IA (pT1c, pN0, cM0, G2, ER+, PR+, HER2-) - Signed by Truitt Merle, MD on 09/10/2022 Stage prefix: Initial diagnosis Histologic grading system: 3 grade system Residual tumor (R): R0 - None     Malignant neoplasm of upper-outer quadrant of right breast in female, estrogen receptor positive ([place])  06/19/2022 Initial Biopsy   Diagnosis 1. Breast, right, needle core biopsy, 12 o'clock, 2cmfn INVASIVE MODERATELY DIFFERENTIATED DUCTAL ADENOCARCINOMA, GRADE 2 (3+2+1) MICROCALCIFICATIONS PRESENT NEGATIVE FOR LYMPHOVASCULAR INVASION TUMOR MEASURES 6 MM IN GREATEST LINEAR EXTENT 2. Breast, right, needle core biopsy, 9:30 o'clock, 6cmfn BENIGN BREAST WITH FIBROCYSTIC CHANGES INCLUDING STROMAL FIBROSIS AND USUAL DUCT HYPERPLASIA PSEUDOANGIOMATOUS STROMAL HYPERPLASIA (PASH) NEGATIVE FOR MICROCALCIFICATIONS NEGATIVE FOR CARCINOMA 3. Breast, right, needle core biopsy, 10 o'clock, 8cmfn COMPATIBLE WITH A BENIGN FIBROADENOMA RARE MICROCALCIFICATIONS PRESENT NEGATIVE FOR MALIGNANCY Diagnosis Note 1. An immunohistochemical stain for E-cadherin is performed with adequate control and is positive within the tumor supporting a ductal differentiation.  1. PROGNOSTIC INDICATORS Results: The tumor cells are equivocal for Her2 (2+). Her2 by FISH will be performed and the results reported separately. Estrogen Receptor: 100%, POSITIVE, STRONG STAINING INTENSITY Progesterone Receptor: 90%, POSITIVE, STRONG STAINING INTENSITY Proliferation Marker Ki67: 10%  1. FLUORESCENCE IN-SITU HYBRIDIZATION Results: GROUP 5: HER2 **NEGATIVE**   06/26/2022 Initial Diagnosis   Malignant neoplasm of upper-outer quadrant of right breast in female, estrogen receptor positive ([place])   06/26/2022 Cancer Staging   Staging form: Breast, AJCC 8th Edition - Clinical: Stage IA (cT1c,  cN0, cM0, G2, ER+, PR+, HER2-) - Signed by Hayden Pedro, PA-C on 06/26/2022 Stage prefix: Initial diagnosis Method of lymph node assessment:  Clinical Histologic grading system: 3 grade system   07/07/2022 Genetic Testing   Negative hereditary cancer genetic testing: no pathogenic variants detected in [place] Expanded +RNAinsight Panel.  VUS in PTCH1 at p.P725R (c.2174C>G).   Report date is 07/07/2022.   The CancerNext-Expanded gene panel offered by [facility] and includes sequencing, rearrangement, and RNA analysis for the following 77 genes: AIP, ALK, APC, ATM, AXIN2, BAP1, BARD1, BLM, BMPR1A, BRCA1, BRCA2, BRIP1, CDC73, CDH1, CDK4, CDKN1B, CDKN2A, CHEK2, CTNNA1, DICER1, FANCC, FH, FLCN, GALNT12, KIF1B, LZTR1, MAX, MEN1, MET, MLH1, MSH2, MSH3, MSH6, MUTYH, NBN, NF1, NF2, NTHL1, PALB2, PHOX2B, PMS2, POT1, PRKAR1A, PTCH1, PTEN, RAD51C, RAD51D, RB1, RECQL, RET, SDHA, SDHAF2, SDHB, SDHC, SDHD, SMAD4, SMARCA4, SMARCB1, SMARCE1, STK11, SUFU, TMEM127, TP53, TSC1, TSC2, VHL and XRCC2 (sequencing and deletion/duplication); EGFR, EGLN1, HOXB13, KIT, MITF, PDGFRA, POLD1, and POLE (sequencing only); EPCAM and GREM1 (deletion/duplication only).    07/14/2022 Cancer Staging   Staging form: Breast, AJCC 8th Edition - Pathologic stage from 07/14/2022: Stage IA (pT1c, pN0, cM0, G2, ER+, PR+, HER2-) - Signed by Truitt Merle, MD on 09/10/2022 Stage prefix: Initial diagnosis Histologic grading system: 3 grade system Residual tumor (R): R0 - None    Imaging      Oncotype testing        INTERVAL HISTORY:  Janice Wagner is here for a follow up of Right Breast Cancer, ER+  She was last seen by me on 09/11/2022 She presents to the clinic accompanied by husband  Pt reports the she started taking the exemestane  the first of the year. Pt states she has some hot flashes , that  are tolerable.     All other systems were reviewed with the patient and are negative.  MEDICAL HISTORY:  Past  Medical History:  Diagnosis Date   Arthritis    Empty sella syndrome ([place])    Family history of breast cancer 06/28/2022   Family history of colon cancer 06/28/2022   GERD (gastroesophageal reflux disease)    H/O: vasectomy    husband with vasectomy   Hypertension    Insulin resistance    Migraines    Ovarian cyst    Reflux    Sleep apnea    has used CPAP, not currently    SURGICAL HISTORY: Past Surgical History:  Procedure Laterality Date   ABDOMINAL SURGERY     Laparotomy-Ovarian cyst   BREAST LUMPECTOMY WITH RADIOACTIVE SEED AND SENTINEL LYMPH NODE BIOPSY Right 07/14/2022   Procedure: RIGHT BREAST LUMPECTOMY WITH RADIOACTIVE SEED AND SENTINEL LYMPH NODE BIOPSY;  Surgeon: Coralie Keens, MD;  Location: [facility];  Service: General;  Laterality: Right;   [place]  11/2002   VAGINAL HYST., POSTERIOR REPAIR    I have reviewed the social history and family history with the patient and they are unchanged from previous note.  ALLERGIES:  is allergic to penicillins.  MEDICATIONS:  Current Outpatient Medications  Medication Sig Dispense Refill   amLODipine (NORVASC) 5 MG tablet Take 5 mg by mouth daily.     celecoxib (CELEBREX) 200 MG capsule Take 200 mg by mouth daily.     Cetirizine HCl (ZYRTEC ALLERGY PO) Take by mouth.     DULoxetine (CYMBALTA) 30 MG capsule Take 30 mg by mouth daily.     exemestane (AROMASIN) 25 MG tablet Take 1 tablet (25 mg total) by mouth daily after breakfast. 90 tablet 1   famotidine (PEPCID) 40 MG  tablet Take 40 mg by mouth 2 (two) times daily.     lisinopril (ZESTRIL) 20 MG tablet Take 20 mg by mouth daily.     metFORMIN (GLUCOPHAGE-XR) 500 MG 24 hr tablet Take 500 mg by mouth 2 (two) times daily.     ondansetron (ZOFRAN) 4 MG tablet Take 1 tablet (4 mg total) by mouth every 8 (eight) hours as needed for nausea or vomiting. 20 tablet 0   ondansetron (ZOFRAN-ODT) 4 MG  disintegrating tablet Take 4 mg by mouth every 8 (eight) hours as needed for nausea or vomiting.     topiramate (TOPAMAX) 25 MG tablet Take 25 mg by mouth daily.     traMADol (ULTRAM) 50 MG tablet Take 1 tablet (50 mg total) by mouth every 6 (six) hours as needed for moderate pain or severe pain. 25 tablet 0   No current facility-administered medications for this visit.    PHYSICAL EXAMINATION: ECOG PERFORMANCE STATUS: 0 - Asymptomatic  Vitals:   11/10/22 1321  BP: (!) 152/86  Pulse: 83  Resp: 19  Temp: 98.4 F (36.9 C)  SpO2: 99%   Wt Readings from Last 3 Encounters:  11/10/22 208 lb 6 oz (94.5 kg)  11/06/22 206 lb 4 oz (93.6 kg)  09/11/22 209 lb 8 oz (95 kg)     LYMPH:(-)  no palpable lymphadenopathy in the cervical, axillary  ABDOMEN:abdomen soft, tender and normal bowel sounds BREAST: RT Breast lumpectomy, scar tissue above the incision,LT Breast no palpable mass, breast exam benign.    LABORATORY DATA:  I have reviewed the data as listed    Latest Ref Rng & Units 11/10/2022    1:49 PM 06/28/2022   11:52 AM  CBC  WBC 4.0 - 10.5 K/uL 9.9  9.7   Hemoglobin 12.0 - 15.0 g/dL 13.3  12.6   Hematocrit 36.0 - 46.0 % 42.3  39.6   Platelets 150 - 400 K/uL 395  386         Latest Ref Rng & Units 11/10/2022    1:49 PM 06/28/2022   11:52 AM  CMP  Glucose 70 - 99 mg/dL 105  137   BUN 6 - 20 mg/dL 14  13   Creatinine 0.44 - 1.00 mg/dL 1.13  1.15   Sodium 135 - 145 mmol/L 142  139   Potassium 3.5 - 5.1 mmol/L 5.0  3.9   Chloride 98 - 111 mmol/L 109  106   CO2 22 - 32 mmol/L 28  26   Calcium 8.9 - 10.3 mg/dL 9.8  9.1   Total Protein 6.5 - 8.1 g/dL 6.9  6.7   Total Bilirubin 0.3 - 1.2 mg/dL 0.7  0.7   Alkaline Phos 38 - 126 U/L 86  104   AST 15 - 41 U/L 18  18   ALT 0 - 44 U/L 20  15       RADIOGRAPHIC STUDIES: I have personally reviewed the radiological images as listed and agreed with the findings in the report. No results found.    Orders Placed This Encounter   Procedures   Comprehensive metabolic panel    Standing Status:   Standing    Number of Occurrences:   50    Standing Expiration Date:   11/11/2023   CBC with Differential/Platelet    Standing Status:   Standing    Number of Occurrences:   50    Standing Expiration Date:   11/11/2023   All questions were answered. The patient knows  to call the clinic with any problems, questions or concerns. No barriers to learning was detected. The total time spent in the appointment was 30 minutes.     Truitt Merle, MD 11/10/2022   Felicity Coyer, CMA, am acting as scribe for Truitt Merle, MD.   I have reviewed the above documentation for accuracy and completeness, and I agree with the above.

## 2022-11-10 ENCOUNTER — Encounter: Payer: Self-pay | Admitting: Hematology

## 2022-11-10 ENCOUNTER — Other Ambulatory Visit: Payer: Self-pay

## 2022-11-10 ENCOUNTER — Inpatient Hospital Stay: Payer: BC Managed Care – PPO | Attending: Hematology | Admitting: Hematology

## 2022-11-10 ENCOUNTER — Inpatient Hospital Stay: Payer: BC Managed Care – PPO

## 2022-11-10 VITALS — BP 152/86 | HR 83 | Temp 98.4°F | Resp 19 | Wt 208.4 lb

## 2022-11-10 DIAGNOSIS — I10 Essential (primary) hypertension: Secondary | ICD-10-CM | POA: Insufficient documentation

## 2022-11-10 DIAGNOSIS — Z803 Family history of malignant neoplasm of breast: Secondary | ICD-10-CM | POA: Diagnosis not present

## 2022-11-10 DIAGNOSIS — Z800 Family history of malignant neoplasm of digestive organs: Secondary | ICD-10-CM | POA: Insufficient documentation

## 2022-11-10 DIAGNOSIS — Z170 Estrogen receptor positive status [ER+]: Secondary | ICD-10-CM

## 2022-11-10 DIAGNOSIS — Z923 Personal history of irradiation: Secondary | ICD-10-CM | POA: Diagnosis not present

## 2022-11-10 DIAGNOSIS — C50411 Malignant neoplasm of upper-outer quadrant of right female breast: Secondary | ICD-10-CM

## 2022-11-10 DIAGNOSIS — Z79811 Long term (current) use of aromatase inhibitors: Secondary | ICD-10-CM | POA: Diagnosis not present

## 2022-11-10 LAB — CBC WITH DIFFERENTIAL/PLATELET
Abs Immature Granulocytes: 0.05 10*3/uL (ref 0.00–0.07)
Basophils Absolute: 0.1 10*3/uL (ref 0.0–0.1)
Basophils Relative: 1 %
Eosinophils Absolute: 0.3 10*3/uL (ref 0.0–0.5)
Eosinophils Relative: 3 %
HCT: 42.3 % (ref 36.0–46.0)
Hemoglobin: 13.3 g/dL (ref 12.0–15.0)
Immature Granulocytes: 1 %
Lymphocytes Relative: 11 %
Lymphs Abs: 1 10*3/uL (ref 0.7–4.0)
MCH: 28.2 pg (ref 26.0–34.0)
MCHC: 31.4 g/dL (ref 30.0–36.0)
MCV: 89.6 fL (ref 80.0–100.0)
Monocytes Absolute: 0.8 10*3/uL (ref 0.1–1.0)
Monocytes Relative: 8 %
Neutro Abs: 7.6 10*3/uL (ref 1.7–7.7)
Neutrophils Relative %: 76 %
Platelets: 395 10*3/uL (ref 150–400)
RBC: 4.72 MIL/uL (ref 3.87–5.11)
RDW: 15.1 % (ref 11.5–15.5)
WBC: 9.9 10*3/uL (ref 4.0–10.5)
nRBC: 0 % (ref 0.0–0.2)

## 2022-11-10 LAB — COMPREHENSIVE METABOLIC PANEL
ALT: 20 U/L (ref 0–44)
AST: 18 U/L (ref 15–41)
Albumin: 4.3 g/dL (ref 3.5–5.0)
Alkaline Phosphatase: 86 U/L (ref 38–126)
Anion gap: 5 (ref 5–15)
BUN: 14 mg/dL (ref 6–20)
CO2: 28 mmol/L (ref 22–32)
Calcium: 9.8 mg/dL (ref 8.9–10.3)
Chloride: 109 mmol/L (ref 98–111)
Creatinine, Ser: 1.13 mg/dL — ABNORMAL HIGH (ref 0.44–1.00)
GFR, Estimated: 56 mL/min — ABNORMAL LOW (ref >60)
Glucose, Bld: 105 mg/dL — ABNORMAL HIGH (ref 70–99)
Potassium: 5 mmol/L (ref 3.5–5.1)
Sodium: 142 mmol/L (ref 135–145)
Total Bilirubin: 0.7 mg/dL (ref 0.3–1.2)
Total Protein: 6.9 g/dL (ref 6.5–8.1)

## 2022-11-10 MED ORDER — EXEMESTANE 25 MG PO TABS: 25.0000 mg | ORAL_TABLET | Freq: Every day | ORAL | 1 refills | Status: DC

## 2022-11-10 NOTE — Assessment & Plan Note (Signed)
Stage IA, p(T1c, N0), ER+/PR+/HER2-, Grade 2  -Diagnosed in September 2023 by screening mammogram. -Status postlumpectomy and sentinel lymph node biopsy on July 14, 2022.  Surgical path reviewed with her. -Oncotype recurrence score 14, which predict 9-year distant risk of recurrence 4% with tamoxifen.  In the low risk disease, chemotherapy is not recommended. -She completed adjuvant radiation on 09/15/2022 -We previously discussed adjuvant antiestrogen therapy with exemestane versus tamoxifen, she was supposed to have bone density scan done, which has not been scheduled.  I again reviewed the benefit and potential side effects of anticoagulant therapy with her, she agrees to start exemestane.

## 2022-11-13 ENCOUNTER — Telehealth: Payer: Self-pay

## 2022-11-13 NOTE — Telephone Encounter (Addendum)
Called [facility] to have DEXA scan faxed over spoke with Miracle and received fax.   ----- Message from Truitt Merle, MD sent at 11/10/2022  4:49 PM EST ----- Please call [facility] for her last DEXA report, thx

## 2022-11-15 ENCOUNTER — Encounter: Payer: Self-pay | Admitting: Hematology

## 2022-12-19 ENCOUNTER — Telehealth: Payer: Self-pay | Admitting: Hematology

## 2022-12-19 NOTE — Telephone Encounter (Signed)
Patient called to see if she could switch her virtual visit to an in-person visit since she would be in the area anyway, sent a message to provider as well as scheduler

## 2022-12-20 ENCOUNTER — Telehealth: Payer: Self-pay | Admitting: Nurse Practitioner

## 2022-12-20 ENCOUNTER — Encounter: Payer: Self-pay | Admitting: Nurse Practitioner

## 2022-12-20 NOTE — Progress Notes (Signed)
Spoke to Homer, NP about changing current video appointment to an in-person appointment, [name] said it was fine to change, will call patient with updated information

## 2022-12-20 NOTE — Telephone Encounter (Signed)
Spoke to patient to let her know that her appointment on the 14th is now an in person visit

## 2023-02-06 ENCOUNTER — Other Ambulatory Visit: Payer: Self-pay

## 2023-02-06 ENCOUNTER — Encounter: Payer: Self-pay | Admitting: Nurse Practitioner

## 2023-02-06 ENCOUNTER — Inpatient Hospital Stay: Payer: BC Managed Care – PPO | Attending: Hematology | Admitting: Nurse Practitioner

## 2023-02-06 VITALS — BP 154/90 | HR 75 | Temp 98.7°F | Resp 17 | Wt 207.5 lb

## 2023-02-06 DIAGNOSIS — Z79811 Long term (current) use of aromatase inhibitors: Secondary | ICD-10-CM | POA: Diagnosis not present

## 2023-02-06 DIAGNOSIS — C50411 Malignant neoplasm of upper-outer quadrant of right female breast: Secondary | ICD-10-CM | POA: Diagnosis present

## 2023-02-06 DIAGNOSIS — Z800 Family history of malignant neoplasm of digestive organs: Secondary | ICD-10-CM | POA: Insufficient documentation

## 2023-02-06 DIAGNOSIS — Z803 Family history of malignant neoplasm of breast: Secondary | ICD-10-CM | POA: Insufficient documentation

## 2023-02-06 DIAGNOSIS — Z170 Estrogen receptor positive status [ER+]: Secondary | ICD-10-CM | POA: Diagnosis not present

## 2023-02-06 NOTE — Progress Notes (Signed)
CLINIC:  Survivorship   Patient Care Team: Gordan Payment., MD as PCP - General (Internal Medicine) Pershing Proud, RN as Oncology Nurse Navigator Donnelly Angelica, RN as Oncology Nurse Navigator Abigail Miyamoto, MD as Consulting Physician (General Surgery) Malachy Mood, MD as Consulting Physician (Hematology) Dorothy Puffer, MD as Consulting Physician (Radiation Oncology) Webb Silversmith, MD (Gastroenterology) Pollyann Samples, NP as Nurse Practitioner (Nurse Practitioner)   REASON FOR VISIT:  Routine follow-up post-treatment for a recent history of breast cancer.  BRIEF ONCOLOGIC HISTORY:  Oncology History Overview Note   Cancer Staging  Malignant neoplasm of upper-outer quadrant of right breast in female, estrogen receptor positive (HCC) Staging form: Breast, AJCC 8th Edition - Clinical: Stage IA (cT1c, cN0, cM0, G2, ER+, PR+, HER2-) - Signed by Ronny Bacon, PA-C on 06/26/2022 Stage prefix: Initial diagnosis Method of lymph node assessment: Clinical Histologic grading system: 3 grade system - Pathologic stage from 07/14/2022: Stage IA (pT1c, pN0, cM0, G2, ER+, PR+, HER2-) - Signed by Malachy Mood, MD on 09/10/2022 Stage prefix: Initial diagnosis Histologic grading system: 3 grade system Residual tumor (R): R0 - None     Malignant neoplasm of upper-outer quadrant of right breast in female, estrogen receptor positive (HCC)  06/19/2022 Initial Biopsy   Diagnosis 1. Breast, right, needle core biopsy, 12 o'clock, 2cmfn INVASIVE MODERATELY DIFFERENTIATED DUCTAL ADENOCARCINOMA, GRADE 2 (3+2+1) MICROCALCIFICATIONS PRESENT NEGATIVE FOR LYMPHOVASCULAR INVASION TUMOR MEASURES 6 MM IN GREATEST LINEAR EXTENT 2. Breast, right, needle core biopsy, 9:30 o'clock, 6cmfn BENIGN BREAST WITH FIBROCYSTIC CHANGES INCLUDING STROMAL FIBROSIS AND USUAL DUCT HYPERPLASIA PSEUDOANGIOMATOUS STROMAL HYPERPLASIA (PASH) NEGATIVE FOR MICROCALCIFICATIONS NEGATIVE FOR CARCINOMA 3. Breast, right,  needle core biopsy, 10 o'clock, 8cmfn COMPATIBLE WITH A BENIGN FIBROADENOMA RARE MICROCALCIFICATIONS PRESENT NEGATIVE FOR MALIGNANCY Diagnosis Note 1. An immunohistochemical stain for E-cadherin is performed with adequate control and is positive within the tumor supporting a ductal differentiation.  1. PROGNOSTIC INDICATORS Results: The tumor cells are equivocal for Her2 (2+). Her2 by FISH will be performed and the results reported separately. Estrogen Receptor: 100%, POSITIVE, STRONG STAINING INTENSITY Progesterone Receptor: 90%, POSITIVE, STRONG STAINING INTENSITY Proliferation Marker Ki67: 10%  1. FLUORESCENCE IN-SITU HYBRIDIZATION Results: GROUP 5: HER2 **NEGATIVE**   06/26/2022 Initial Diagnosis   Malignant neoplasm of upper-outer quadrant of right breast in female, estrogen receptor positive (HCC)   06/26/2022 Cancer Staging   Staging form: Breast, AJCC 8th Edition - Clinical: Stage IA (cT1c, cN0, cM0, G2, ER+, PR+, HER2-) - Signed by Ronny Bacon, PA-C on 06/26/2022 Stage prefix: Initial diagnosis Method of lymph node assessment: Clinical Histologic grading system: 3 grade system   07/07/2022 Genetic Testing   Negative hereditary cancer genetic testing: no pathogenic variants detected in [facility] CancerNext Expanded +RNAinsight Panel.  VUS in PTCH1 at p.P725R (c.2174C>G).   Report date is 07/07/2022.   The CancerNext-Expanded gene panel offered by [facility] and includes sequencing, rearrangement, and RNA analysis for the following 77 genes: AIP, ALK, APC, ATM, AXIN2, BAP1, BARD1, BLM, BMPR1A, BRCA1, BRCA2, BRIP1, CDC73, CDH1, CDK4, CDKN1B, CDKN2A, CHEK2, CTNNA1, DICER1, FANCC, FH, FLCN, GALNT12, KIF1B, LZTR1, MAX, MEN1, MET, MLH1, MSH2, MSH3, MSH6, MUTYH, NBN, NF1, NF2, NTHL1, PALB2, PHOX2B, PMS2, POT1, PRKAR1A, PTCH1, PTEN, RAD51C, RAD51D, RB1, RECQL, RET, SDHA, SDHAF2, SDHB, SDHC, SDHD, SMAD4, SMARCA4, SMARCB1, SMARCE1, STK11, SUFU, TMEM127, TP53, TSC1, TSC2, VHL and  XRCC2 (sequencing and deletion/duplication); EGFR, EGLN1, HOXB13, KIT, MITF, PDGFRA, POLD1, and POLE (sequencing only); EPCAM and GREM1 (deletion/duplication only).    07/14/2022 Cancer Staging  Staging form: Breast, AJCC 8th Edition - Pathologic stage from 07/14/2022: Stage IA (pT1c, pN0, cM0, G2, ER+, PR+, HER2-) - Signed by Malachy Mood, MD on 09/10/2022 Stage prefix: Initial diagnosis Histologic grading system: 3 grade system Residual tumor (R): R0 - None    Imaging     07/14/2022 Oncotype testing   Recurrence Score: 14 4% distant recurrence risk at 9 years with AI/tamoxifen alone <1% chemo benefit   08/21/2022 - 09/15/2022 Radiation Therapy   Radiation Treatment Dates: 08/21/2022 through 09/15/2022 Site Technique Total Dose (Gy) Dose per Fx (Gy) Completed Fx Beam Energies  Breast, Right: Breast_R 3D 42.56/42.56 2.66 16/16 10X  Breast, Right: Breast_R_Bst specialPort 8/8 2 4/4 15E      02/06/2023 Survivorship   SCP delivered by Santiago Glad, NP     INTERVAL HISTORY:  Janice Wagner presents to the Survivorship Clinic today for our initial meeting to review her survivorship care plan detailing her treatment course for breast cancer, as well as monitoring long-term side effects of that treatment, education regarding health maintenance, screening, and overall wellness and health promotion.     Overall, Janice Wagner is feeling well. Right breast feels full and heavy, like full of milk when breastfeeding. Denies redness, warmth, fever/chills. She is tolerating exemestane, with mild hot flashes and arthritic neck and back pain. Left shoulder hurts lately, she plans to see ortho for injection. Knows she is "in a funk."     REVIEW OF SYSTEMS:  Review of Systems - Oncology Breast: Denies any new nodularity, masses, tenderness, nipple changes, or nipple discharge.      ONCOLOGY TREATMENT TEAM:  1. Surgeon:  Dr. Magnus Ivan at [facility] 2. Medical Oncologist: Dr. Mosetta Putt  3.  Radiation Oncologist: Dr. Mitzi Hansen    PAST MEDICAL/SURGICAL HISTORY:  Past Medical History:  Diagnosis Date   Arthritis    Empty sella syndrome (HCC)    Family history of breast cancer 06/28/2022   Family history of colon cancer 06/28/2022   GERD (gastroesophageal reflux disease)    H/O: vasectomy    husband with vasectomy   Hypertension    Insulin resistance    Migraines    Ovarian cyst    Reflux    Sleep apnea    has used CPAP, not currently   Past Surgical History:  Procedure Laterality Date   ABDOMINAL SURGERY     Laparotomy-Ovarian cyst   BREAST LUMPECTOMY WITH RADIOACTIVE SEED AND SENTINEL LYMPH NODE BIOPSY Right 07/14/2022   Procedure: RIGHT BREAST LUMPECTOMY WITH RADIOACTIVE SEED AND SENTINEL LYMPH NODE BIOPSY;  Surgeon: Abigail Miyamoto, MD;  Location: [facility];  Service: General;  Laterality: Right;   CHOLECYSTECTOMY     HERNIA REPAIR     VAGINAL HYSTERECTOMY  11/2002   VAGINAL HYST., POSTERIOR REPAIR     ALLERGIES:  Allergies  Allergen Reactions   Penicillins      CURRENT MEDICATIONS:  Outpatient Encounter Medications as of 02/06/2023  Medication Sig   amLODipine (NORVASC) 5 MG tablet Take 5 mg by mouth daily.   celecoxib (CELEBREX) 200 MG capsule Take 200 mg by mouth daily.   Cetirizine HCl (ZYRTEC ALLERGY PO) Take by mouth.   DULoxetine (CYMBALTA) 30 MG capsule Take 30 mg by mouth daily.   exemestane (AROMASIN) 25 MG tablet Take 1 tablet (25 mg total) by mouth daily after breakfast.   famotidine (PEPCID) 40 MG tablet Take 40 mg by mouth 2 (two) times daily.   lisinopril (ZESTRIL) 20 MG tablet Take 20 mg  by mouth daily.   metFORMIN (GLUCOPHAGE-XR) 500 MG 24 hr tablet Take 500 mg by mouth 2 (two) times daily.   ondansetron (ZOFRAN) 4 MG tablet Take 1 tablet (4 mg total) by mouth every 8 (eight) hours as needed for nausea or vomiting.   ondansetron (ZOFRAN-ODT) 4 MG disintegrating tablet Take 4 mg by mouth every 8 (eight) hours as needed for  nausea or vomiting.   topiramate (TOPAMAX) 25 MG tablet Take 25 mg by mouth daily.   traMADol (ULTRAM) 50 MG tablet Take 1 tablet (50 mg total) by mouth every 6 (six) hours as needed for moderate pain or severe pain.   No facility-administered encounter medications on file as of 02/06/2023.     ONCOLOGIC FAMILY HISTORY:  Family History  Problem Relation Age of Onset   Colon polyps Mother        ~10   Colon cancer Maternal Uncle 64   Skin cancer Maternal Uncle    Breast cancer Maternal Grandmother 38   Hypertension Maternal Grandfather    Breast cancer Other        MGM'Janice sisters x2; dx after 73     GENETIC COUNSELING/TESTING: Yes, negative except VUS  SOCIAL HISTORY:  SYAH Wagner is married and lives with her spouse. She has (2 children.  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  She denies any current or history of tobacco, alcohol, or illicit drug use.     PHYSICAL EXAMINATION:  Vital Signs:   Vitals:   02/06/23 1256  BP: (!) 154/90  Pulse: 75  Resp: 17  Temp: 98.7 F (37.1 C)  SpO2: 94%   Filed Weights   02/06/23 1256  Weight: 207 lb 8 oz (94.1 kg)   General: Well-nourished, well-appearing female in no acute distress.   HEENT:   Sclerae anicteric.  Lymph: No cervical, supraclavicular, or infraclavicular lymphadenopathy noted on palpation.  Respiratory: breathing non-labored.  GI: Abdomen soft and round; non-tender, non-distended. Bowel sounds normoactive.  Neuro: No focal deficits. Steady gait.  Psych: Mood and affect normal and appropriate for situation.  Extremities: No edema. MSK: No focal spinal tenderness to palpation.  Full range of motion in bilateral upper extremities Skin: Warm and dry. BREAST: no nipple discharge or inversion. Janice/p R lumpectomy, incisions completely healed. No significant erythema, warmth, or edema. No palpable mass or nodularity in the right breast that I could appreciate. Left breast not palpated   LABORATORY  DATA:  None for this visit.  DIAGNOSTIC IMAGING:  None for this visit.      ASSESSMENT AND PLAN:  Ms.. Wagner is a pleasant 62 y.o. 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  She presents to the Survivorship Clinic for our initial meeting and routine follow-up post-completion of treatment for breast cancer.    1. Stage I right breast cancer:  Janice Wagner is continuing to recover from definitive treatment for breast cancer. She will follow-up with her medical oncologist, Dr. Mosetta Putt in 04/2023 with history and physical exam per surveillance protocol.  She will continue her anti-estrogen therapy with exemestane. Thus far, she is tolerating well, with mild side effects including hot flash and joint pain (arthritis in neck/back with left shoulder bursitis at baseline). She was instructed to make Dr. Mosetta Putt or myself aware if she begins to experience any worsening side effects of the medication and I could see her  back in clinic to help manage those side effects, as needed. Today, a comprehensive survivorship care plan and treatment summary was reviewed with the patient today detailing her breast cancer diagnosis, treatment course, potential late/long-term effects of treatment, appropriate follow-up care with recommendations for the future, and patient education resources.  A copy of this summary, along with a letter will be sent to the patient'Janice primary care provider via mail/fax/In Basket message after today'Janice visit.    2. Bone health:  Given Janice Wagner age/history of breast cancer and her current treatment regimen including anti-estrogen therapy with exemestane, she is at risk for bone demineralization.  Her last DEXA scan was normal on 08/2022. I encouraged her to take daily calcium/vit D and increase weight bearing exercise.  She was given  education on specific activities to promote bone health.  3. Cancer screening:  Due to Janice Wagner'Janice history and her age, she should receive screening for skin cancers and colon cancers. 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 Charm Barges. She is Janice/p partial hysterectomy with ovaries intact. The information and recommendations are listed on the patient'Janice comprehensive care plan/treatment summary and were reviewed in detail with the patient.    4. Health maintenance and wellness promotion: Janice Wagner was encouraged to consume 5-7 servings of fruits and vegetables per day. She was also encouraged to engage in moderate to vigorous exercise for 30 minutes per day most days of the week.   She was instructed to limit her alcohol consumption and continue to abstain from tobacco use   5. Support services/counseling: It is not uncommon for this period of the patient'Janice cancer care trajectory to be one of many emotions and stressors.  We discussed an opportunity for her to participate in the next session of [facility] ("Finding Your New Normal") support group series designed for patients after they have completed treatment.   Janice Wagner was encouraged to take advantage of our many other support services programs, support groups, and/or counseling in coping with her new life as a cancer survivor after completing anti-cancer treatment.  She was offered support today through active listening and expressive supportive counseling.  She was given information regarding our available services and encouraged to contact me with any questions or for help enrolling in any of our support group/programs.    Dispo:   -Return to cancer center 05/10/23 -Mammogram due in 06/05/23 as previously scheduled.  -Follow up with surgery next month  -She is welcome to return back to the Survivorship Clinic at any time; no additional follow-up needed at this time.  -Consider referral back to survivorship as a long-term survivor  for continued surveillance  A total of (30) minutes of face-to-face time was spent with this patient with greater than 50% of that time in counseling and care-coordination.   Santiago Glad, NP Survivorship Program [facility] [phone]   Note: PRIMARY CARE PROVIDER Gordan Payment., MD [phone]

## 2023-02-12 ENCOUNTER — Ambulatory Visit: Payer: BC Managed Care – PPO | Attending: Surgery

## 2023-02-12 VITALS — Wt 209.2 lb

## 2023-02-12 DIAGNOSIS — Z483 Aftercare following surgery for neoplasm: Secondary | ICD-10-CM | POA: Insufficient documentation

## 2023-02-12 NOTE — Therapy (Signed)
OUTPATIENT PHYSICAL THERAPY SOZO SCREENING NOTE   Patient Name: Janice Wagner MRN: [id number] DOB:1961/07/01, 62 y.o., female Today's Date: 02/12/2023  PCP: Gordan Payment., MD REFERRING PROVIDER: Abigail Miyamoto, MD   PT End of Session - 02/12/23 1511     Visit Number 2   # unchanged due to screen only   PT Start Time 1509    PT Stop Time 1514    PT Time Calculation (min) 5 min    Activity Tolerance Patient tolerated treatment well    Behavior During Therapy [facility] for tasks assessed/performed             Past Medical History:  Diagnosis Date   Arthritis    Empty sella syndrome (HCC)    Family history of breast cancer 06/28/2022   Family history of colon cancer 06/28/2022   GERD (gastroesophageal reflux disease)    H/O: vasectomy    husband with vasectomy   Hypertension    Insulin resistance    Migraines    Ovarian cyst    Reflux    Sleep apnea    has used CPAP, not currently   Past Surgical History:  Procedure Laterality Date   ABDOMINAL SURGERY     Laparotomy-Ovarian cyst   BREAST LUMPECTOMY WITH RADIOACTIVE SEED AND SENTINEL LYMPH NODE BIOPSY Right 07/14/2022   Procedure: RIGHT BREAST LUMPECTOMY WITH RADIOACTIVE SEED AND SENTINEL LYMPH NODE BIOPSY;  Surgeon: Abigail Miyamoto, MD;  Location: [facility];  Service: General;  Laterality: Right;   CHOLECYSTECTOMY     HERNIA REPAIR     VAGINAL HYSTERECTOMY  11/2002   VAGINAL HYST., POSTERIOR REPAIR   Patient Active Problem List   Diagnosis Date Noted   Genetic testing 07/04/2022   Family history of breast cancer 06/28/2022   Family history of colon cancer 06/28/2022   Malignant neoplasm of upper-outer quadrant of right breast in female, estrogen receptor positive (HCC) 06/26/2022   Ovarian cyst    Reflux    Arthritis    Insulin resistance    Empty sella syndrome (HCC)    H/O: vasectomy     REFERRING DIAG: right breast cancer at risk for lymphedema  THERAPY DIAG:  Aftercare following  surgery for neoplasm  PERTINENT HISTORY: Patient was diagnosed on 06/07/2022 with right grade 2 invasive ductal carcinoma breast cancer. It measures 1.8 cm and is located in the upper outer quadrant. It is ER/PR positive and HER negative with a Ki67 of 10%. She had a right lumpectomy with SLNB on 07/14/2022. Her Oncotype is 14. She will have her radiation simulation today and will have 4 weeks of radiation.   PRECAUTIONS: right UE Lymphedema risk, None  SUBJECTIVE: Pt returns for her 3 month L-Dex screen.   PAIN:  Are you having pain? No  SOZO SCREENING: Patient was assessed today using the SOZO machine to determine the lymphedema index score. This was compared to her baseline score. It was determined that she is within the recommended range when compared to her baseline and no further action is needed at this time. She will continue SOZO screenings. These are done every 3 months for 2 years post operatively followed by every 6 months for 2 years, and then annually.   L-DEX FLOWSHEETS - 02/12/23 1500       L-DEX LYMPHEDEMA SCREENING   Measurement Type Unilateral    L-DEX MEASUREMENT EXTREMITY Upper Extremity    POSITION  Standing    DOMINANT SIDE Right    At Risk Side Right  BASELINE SCORE (UNILATERAL) 5.1    L-DEX SCORE (UNILATERAL) 6.2    VALUE CHANGE (UNILAT) 1.1              Hermenia Bers, PTA 02/12/2023, 3:13 PM

## 2023-05-10 ENCOUNTER — Inpatient Hospital Stay: Payer: BC Managed Care – PPO | Attending: Hematology

## 2023-05-10 ENCOUNTER — Inpatient Hospital Stay: Payer: BC Managed Care – PPO | Admitting: Hematology

## 2023-05-10 VITALS — BP 141/89 | HR 64 | Temp 97.9°F | Resp 18 | Ht 64.5 in | Wt 206.8 lb

## 2023-05-10 DIAGNOSIS — C50411 Malignant neoplasm of upper-outer quadrant of right female breast: Secondary | ICD-10-CM | POA: Diagnosis present

## 2023-05-10 DIAGNOSIS — Z79811 Long term (current) use of aromatase inhibitors: Secondary | ICD-10-CM | POA: Diagnosis not present

## 2023-05-10 DIAGNOSIS — Z923 Personal history of irradiation: Secondary | ICD-10-CM | POA: Insufficient documentation

## 2023-05-10 DIAGNOSIS — Z803 Family history of malignant neoplasm of breast: Secondary | ICD-10-CM | POA: Insufficient documentation

## 2023-05-10 DIAGNOSIS — Z800 Family history of malignant neoplasm of digestive organs: Secondary | ICD-10-CM | POA: Diagnosis not present

## 2023-05-10 DIAGNOSIS — Z170 Estrogen receptor positive status [ER+]: Secondary | ICD-10-CM | POA: Diagnosis not present

## 2023-05-10 LAB — CBC WITH DIFFERENTIAL/PLATELET
Abs Immature Granulocytes: 0.02 10*3/uL (ref 0.00–0.07)
Basophils Absolute: 0.1 10*3/uL (ref 0.0–0.1)
Basophils Relative: 1 %
Eosinophils Absolute: 0.1 10*3/uL (ref 0.0–0.5)
Eosinophils Relative: 1 %
HCT: 41 % (ref 36.0–46.0)
Hemoglobin: 12.6 g/dL (ref 12.0–15.0)
Immature Granulocytes: 0 %
Lymphocytes Relative: 14 %
Lymphs Abs: 1 10*3/uL (ref 0.7–4.0)
MCH: 27.3 pg (ref 26.0–34.0)
MCHC: 30.7 g/dL (ref 30.0–36.0)
MCV: 88.7 fL (ref 80.0–100.0)
Monocytes Absolute: 0.8 10*3/uL (ref 0.1–1.0)
Monocytes Relative: 11 %
Neutro Abs: 5.2 10*3/uL (ref 1.7–7.7)
Neutrophils Relative %: 73 %
Platelets: 336 10*3/uL (ref 150–400)
RBC: 4.62 MIL/uL (ref 3.87–5.11)
RDW: 15.1 % (ref 11.5–15.5)
WBC: 7.2 10*3/uL (ref 4.0–10.5)
nRBC: 0 % (ref 0.0–0.2)

## 2023-05-10 LAB — COMPREHENSIVE METABOLIC PANEL
ALT: 12 U/L (ref 0–44)
AST: 16 U/L (ref 15–41)
Albumin: 4.2 g/dL (ref 3.5–5.0)
Alkaline Phosphatase: 84 U/L (ref 38–126)
Anion gap: 6 (ref 5–15)
BUN: 10 mg/dL (ref 8–23)
CO2: 27 mmol/L (ref 22–32)
Calcium: 9.1 mg/dL (ref 8.9–10.3)
Chloride: 108 mmol/L (ref 98–111)
Creatinine, Ser: 1.07 mg/dL — ABNORMAL HIGH (ref 0.44–1.00)
GFR, Estimated: 59 mL/min — ABNORMAL LOW (ref >60)
Glucose, Bld: 101 mg/dL — ABNORMAL HIGH (ref 70–99)
Potassium: 4.5 mmol/L (ref 3.5–5.1)
Sodium: 141 mmol/L (ref 135–145)
Total Bilirubin: 0.8 mg/dL (ref 0.3–1.2)
Total Protein: 6.8 g/dL (ref 6.5–8.1)

## 2023-05-10 MED ORDER — EXEMESTANE 25 MG PO TABS: 25.0000 mg | ORAL_TABLET | Freq: Every day | ORAL | 1 refills | Status: DC

## 2023-05-10 NOTE — Progress Notes (Signed)
[facility]   [phone] [phone]   Clinic Follow up Note   Patient Care Team: Gordan Payment., MD as PCP - General (Internal Medicine) Pershing Proud, RN as Oncology Nurse Navigator Donnelly Angelica, RN as Oncology Nurse Navigator Abigail Miyamoto, MD as Consulting Physician (General Surgery) Malachy Mood, MD as Consulting Physician (Hematology) Dorothy Puffer, MD as Consulting Physician (Radiation Oncology) Webb Silversmith, MD (Gastroenterology) Pollyann Samples, NP as Nurse Practitioner (Nurse Practitioner)  Date of Service:  05/10/2023  CHIEF COMPLAINT: f/u of right breast cancer  CURRENT THERAPY:  Exemestane started 10/2022  ASSESSMENT:  Janice Wagner is a 62 y.o. female with   Malignant neoplasm of upper-outer quadrant of right breast in female, estrogen receptor positive (HCC) Stage IA, p(T1c, N0), ER+/PR+/HER2-, Grade 2  -Diagnosed in September 2023 by screening mammogram. -Status postlumpectomy and sentinel lymph node biopsy on July 14, 2022.  Surgical path reviewed with her. -Oncotype recurrence score 14, which predict 9-year distant risk of recurrence 4% with tamoxifen.  In the low risk disease, chemotherapy is not recommended. -She completed adjuvant radiation on 09/15/2022 -she started adjuvant exemestane in Feb 2024.  She is overall tolerating well without significant side effect. -She is clinically doing well, exam unremarkable, no clinical concern for recurrence.    PLAN: -lab reviewed -Mammogram schedule 9/10 -Continue exemestane -lab and f/u in 6 months  SUMMARY OF ONCOLOGIC HISTORY: Oncology History Overview Note   Cancer Staging  Malignant neoplasm of upper-outer quadrant of right breast in female, estrogen receptor positive (HCC) Staging form: Breast, AJCC 8th Edition - Clinical: Stage IA (cT1c, cN0, cM0, G2, ER+, PR+, HER2-) - Signed by Ronny Bacon, PA-C on 06/26/2022 Stage prefix: Initial  diagnosis Method of lymph node assessment: Clinical Histologic grading system: 3 grade system - Pathologic stage from 07/14/2022: Stage IA (pT1c, pN0, cM0, G2, ER+, PR+, HER2-) - Signed by Malachy Mood, MD on 09/10/2022 Stage prefix: Initial diagnosis Histologic grading system: 3 grade system Residual tumor (R): R0 - None     Malignant neoplasm of upper-outer quadrant of right breast in female, estrogen receptor positive (HCC)  06/19/2022 Initial Biopsy   Diagnosis 1. Breast, right, needle core biopsy, 12 o'clock, 2cmfn INVASIVE MODERATELY DIFFERENTIATED DUCTAL ADENOCARCINOMA, GRADE 2 (3+2+1) MICROCALCIFICATIONS PRESENT NEGATIVE FOR LYMPHOVASCULAR INVASION TUMOR MEASURES 6 MM IN GREATEST LINEAR EXTENT 2. Breast, right, needle core biopsy, 9:30 o'clock, 6cmfn BENIGN BREAST WITH FIBROCYSTIC CHANGES INCLUDING STROMAL FIBROSIS AND USUAL DUCT HYPERPLASIA PSEUDOANGIOMATOUS STROMAL HYPERPLASIA (PASH) NEGATIVE FOR MICROCALCIFICATIONS NEGATIVE FOR CARCINOMA 3. Breast, right, needle core biopsy, 10 o'clock, 8cmfn COMPATIBLE WITH A BENIGN FIBROADENOMA RARE MICROCALCIFICATIONS PRESENT NEGATIVE FOR MALIGNANCY Diagnosis Note 1. An immunohistochemical stain for E-cadherin is performed with adequate control and is positive within the tumor supporting a ductal differentiation.  1. PROGNOSTIC INDICATORS Results: The tumor cells are equivocal for Her2 (2+). Her2 by FISH will be performed and the results reported separately. Estrogen Receptor: 100%, POSITIVE, STRONG STAINING INTENSITY Progesterone Receptor: 90%, POSITIVE, STRONG STAINING INTENSITY Proliferation Marker Ki67: 10%  1. FLUORESCENCE IN-SITU HYBRIDIZATION Results: GROUP 5: HER2 **NEGATIVE**   06/26/2022 Initial Diagnosis   Malignant neoplasm of upper-outer quadrant of right breast in female, estrogen receptor positive (HCC)   06/26/2022 Cancer Staging   Staging form: Breast, AJCC 8th Edition - Clinical: Stage IA (cT1c, cN0, cM0, G2,  ER+, PR+, HER2-) - Signed by Ronny Bacon, PA-C on 06/26/2022 Stage prefix: Initial diagnosis Method of lymph node assessment: Clinical Histologic grading system: 3  grade system   07/07/2022 Genetic Testing   Negative hereditary cancer genetic testing: no pathogenic variants detected in [facility] CancerNext Expanded +RNAinsight Panel.  VUS in PTCH1 at p.P725R (c.2174C>G).   Report date is 07/07/2022.   The CancerNext-Expanded gene panel offered by [facility] and includes sequencing, rearrangement, and RNA analysis for the following 77 genes: AIP, ALK, APC, ATM, AXIN2, BAP1, BARD1, BLM, BMPR1A, BRCA1, BRCA2, BRIP1, CDC73, CDH1, CDK4, CDKN1B, CDKN2A, CHEK2, CTNNA1, DICER1, FANCC, FH, FLCN, GALNT12, KIF1B, LZTR1, MAX, MEN1, MET, MLH1, MSH2, MSH3, MSH6, MUTYH, NBN, NF1, NF2, NTHL1, PALB2, PHOX2B, PMS2, POT1, PRKAR1A, PTCH1, PTEN, RAD51C, RAD51D, RB1, RECQL, RET, SDHA, SDHAF2, SDHB, SDHC, SDHD, SMAD4, SMARCA4, SMARCB1, SMARCE1, STK11, SUFU, TMEM127, TP53, TSC1, TSC2, VHL and XRCC2 (sequencing and deletion/duplication); EGFR, EGLN1, HOXB13, KIT, MITF, PDGFRA, POLD1, and POLE (sequencing only); EPCAM and GREM1 (deletion/duplication only).    07/14/2022 Cancer Staging   Staging form: Breast, AJCC 8th Edition - Pathologic stage from 07/14/2022: Stage IA (pT1c, pN0, cM0, G2, ER+, PR+, HER2-) - Signed by Malachy Mood, MD on 09/10/2022 Stage prefix: Initial diagnosis Histologic grading system: 3 grade system Residual tumor (R): R0 - None    Imaging     07/14/2022 Oncotype testing   Recurrence Score: 14 4% distant recurrence risk at 9 years with AI/tamoxifen alone <1% chemo benefit   08/21/2022 - 09/15/2022 Radiation Therapy   Radiation Treatment Dates: 08/21/2022 through 09/15/2022 Site Technique Total Dose (Gy) Dose per Fx (Gy) Completed Fx Beam Energies  Breast, Right: Breast_R 3D 42.56/42.56 2.66 16/16 10X  Breast, Right: Breast_R_Bst specialPort 8/8 2 4/4 15E      02/06/2023 Survivorship    SCP delivered by Santiago Glad, NP      INTERVAL HISTORY:  Janice Wagner is here for a follow up of right breast cancer She was last seen by NP [name] on 02/06/2023. She presents to the clinic accompanied by husband. Pt state that she is doing well on the Exemestane. Pt is clinically doing well.      All other systems were reviewed with the patient and are negative.  MEDICAL HISTORY:  Past Medical History:  Diagnosis Date   Arthritis    Empty sella syndrome (HCC)    Family history of breast cancer 06/28/2022   Family history of colon cancer 06/28/2022   GERD (gastroesophageal reflux disease)    H/O: vasectomy    husband with vasectomy   Hypertension    Insulin resistance    Migraines    Ovarian cyst    Reflux    Sleep apnea    has used CPAP, not currently    SURGICAL HISTORY: Past Surgical History:  Procedure Laterality Date   ABDOMINAL SURGERY     Laparotomy-Ovarian cyst   BREAST LUMPECTOMY WITH RADIOACTIVE SEED AND SENTINEL LYMPH NODE BIOPSY Right 07/14/2022   Procedure: RIGHT BREAST LUMPECTOMY WITH RADIOACTIVE SEED AND SENTINEL LYMPH NODE BIOPSY;  Surgeon: Abigail Miyamoto, MD;  Location: [facility];  Service: General;  Laterality: Right;   CHOLECYSTECTOMY     HERNIA REPAIR     VAGINAL HYSTERECTOMY  11/2002   VAGINAL HYST., POSTERIOR REPAIR    I have reviewed the social history and family history with the patient and they are unchanged from previous note.  ALLERGIES:  is allergic to penicillins.  MEDICATIONS:  Current Outpatient Medications  Medication Sig Dispense Refill   amLODipine (NORVASC) 5 MG tablet Take 5 mg by mouth daily.     celecoxib (CELEBREX) 200 MG capsule Take 200  mg by mouth daily.     Cetirizine HCl (ZYRTEC ALLERGY PO) Take by mouth.     DULoxetine (CYMBALTA) 30 MG capsule Take 30 mg by mouth daily.     exemestane (AROMASIN) 25 MG tablet Take 1 tablet (25 mg total) by mouth daily after breakfast. 90 tablet 1   famotidine  (PEPCID) 40 MG tablet Take 40 mg by mouth 2 (two) times daily.     lisinopril (ZESTRIL) 20 MG tablet Take 20 mg by mouth daily.     metFORMIN (GLUCOPHAGE-XR) 500 MG 24 hr tablet Take 500 mg by mouth 2 (two) times daily.     ondansetron (ZOFRAN) 4 MG tablet Take 1 tablet (4 mg total) by mouth every 8 (eight) hours as needed for nausea or vomiting. 20 tablet 0   ondansetron (ZOFRAN-ODT) 4 MG disintegrating tablet Take 4 mg by mouth every 8 (eight) hours as needed for nausea or vomiting.     topiramate (TOPAMAX) 25 MG tablet Take 25 mg by mouth daily.     No current facility-administered medications for this visit.    PHYSICAL EXAMINATION: ECOG PERFORMANCE STATUS: 0 - Asymptomatic  Vitals:   05/10/23 1354  BP: (!) 141/89  Pulse: 64  Resp: 18  Temp: 97.9 F (36.6 C)  SpO2: 100%   Wt Readings from Last 3 Encounters:  05/10/23 206 lb 12.8 oz (93.8 kg)  02/12/23 209 lb 4 oz (94.9 kg)  02/06/23 207 lb 8 oz (94.1 kg)     GENERAL:alert, no distress and comfortable SKIN: skin color normal, no rashes or significant lesions EYES: normal, Conjunctiva are pink and non-injected, sclera clear  NEURO: alert & oriented x 3 with fluent speech NECK:(-) supple, thyroid normal size, non-tender, without nodularity LYMPH: (-) no palpable lymphadenopathy in the cervical, axillary  ABDOMEN:(-)abdomen soft, (-) non-tender and(-)  normal bowel sounds BREAST: RT breast lumpectomy no palpable mass,  firm and mild lymphedema breast exam benign. LT breast no palpable mass, breast exam benign LABORATORY DATA:  I have reviewed the data as listed    Latest Ref Rng & Units 05/10/2023    1:37 PM 11/10/2022    1:49 PM 06/28/2022   11:52 AM  CBC  WBC 4.0 - 10.5 K/uL 7.2  9.9  9.7   Hemoglobin 12.0 - 15.0 g/dL 65.7  84.6  96.2   Hematocrit 36.0 - 46.0 % 41.0  42.3  39.6   Platelets 150 - 400 K/uL 336  395  386         Latest Ref Rng & Units 05/10/2023    1:37 PM 11/10/2022    1:49 PM 06/28/2022   11:52 AM   CMP  Glucose 70 - 99 mg/dL 952  841  324   BUN 8 - 23 mg/dL 10  14  13    Creatinine 0.44 - 1.00 mg/dL 4.01  0.27  2.53   Sodium 135 - 145 mmol/L 141  142  139   Potassium 3.5 - 5.1 mmol/L 4.5  5.0  3.9   Chloride 98 - 111 mmol/L 108  109  106   CO2 22 - 32 mmol/L 27  28  26    Calcium 8.9 - 10.3 mg/dL 9.1  9.8  9.1   Total Protein 6.5 - 8.1 g/dL 6.8  6.9  6.7   Total Bilirubin 0.3 - 1.2 mg/dL 0.8  0.7  0.7   Alkaline Phos 38 - 126 U/L 84  86  104   AST 15 - 41 U/L 16  18  18   ALT 0 - 44 U/L 12  20  15        RADIOGRAPHIC STUDIES: I have personally reviewed the radiological images as listed and agreed with the findings in the report. No results found.    No orders of the defined types were placed in this encounter.  All questions were answered. The patient knows to call the clinic with any problems, questions or concerns. No barriers to learning was detected. The total time spent in the appointment was 20 minutes.     Malachy Mood, MD 05/10/2023   Carolin Coy, CMA, am acting as scribe for Malachy Mood, MD.   I have reviewed the above documentation for accuracy and completeness, and I agree with the above.

## 2023-05-10 NOTE — Assessment & Plan Note (Signed)
Stage IA, p(T1c, N0), ER+/PR+/HER2-, Grade 2  -Diagnosed in September 2023 by screening mammogram. -Status postlumpectomy and sentinel lymph node biopsy on July 14, 2022.  Surgical path reviewed with her. -Oncotype recurrence score 14, which predict 9-year distant risk of recurrence 4% with tamoxifen.  In the low risk disease, chemotherapy is not recommended. -She completed adjuvant radiation on 09/15/2022 -she started adjuvant exemestane in Feb 2024

## 2023-06-04 ENCOUNTER — Ambulatory Visit: Payer: BC Managed Care – PPO

## 2023-06-05 ENCOUNTER — Encounter: Payer: Self-pay | Admitting: Rehabilitation

## 2023-06-05 ENCOUNTER — Ambulatory Visit: Payer: BC Managed Care – PPO | Attending: Surgery | Admitting: Rehabilitation

## 2023-06-05 DIAGNOSIS — Z170 Estrogen receptor positive status [ER+]: Secondary | ICD-10-CM | POA: Insufficient documentation

## 2023-06-05 DIAGNOSIS — C50411 Malignant neoplasm of upper-outer quadrant of right female breast: Secondary | ICD-10-CM | POA: Insufficient documentation

## 2023-06-05 DIAGNOSIS — Z483 Aftercare following surgery for neoplasm: Secondary | ICD-10-CM | POA: Insufficient documentation

## 2023-06-05 LAB — HM MAMMOGRAPHY

## 2023-06-05 NOTE — Therapy (Signed)
OUTPATIENT PHYSICAL THERAPY SOZO SCREENING NOTE   Patient Name: Janice Wagner MRN: [id number] DOB:Jun 26, 1961, 62 y.o., female Today's Date: 06/05/2023  PCP: Gordan Payment., MD REFERRING PROVIDER: Abigail Miyamoto, MD   PT End of Session - 06/05/23 1141     Visit Number 2   screen   PT Start Time 1137    PT Stop Time 1144    PT Time Calculation (min) 7 min    Activity Tolerance Patient tolerated treatment well    Behavior During Therapy [facility] for tasks assessed/performed              Past Medical History:  Diagnosis Date   Arthritis    Empty sella syndrome (HCC)    Family history of breast cancer 06/28/2022   Family history of colon cancer 06/28/2022   GERD (gastroesophageal reflux disease)    H/O: vasectomy    husband with vasectomy   Hypertension    Insulin resistance    Migraines    Ovarian cyst    Reflux    Sleep apnea    has used CPAP, not currently   Past Surgical History:  Procedure Laterality Date   ABDOMINAL SURGERY     Laparotomy-Ovarian cyst   BREAST LUMPECTOMY WITH RADIOACTIVE SEED AND SENTINEL LYMPH NODE BIOPSY Right 07/14/2022   Procedure: RIGHT BREAST LUMPECTOMY WITH RADIOACTIVE SEED AND SENTINEL LYMPH NODE BIOPSY;  Surgeon: Abigail Miyamoto, MD;  Location: [facility];  Service: General;  Laterality: Right;   CHOLECYSTECTOMY     HERNIA REPAIR     VAGINAL HYSTERECTOMY  11/2002   VAGINAL HYST., POSTERIOR REPAIR   Patient Active Problem List   Diagnosis Date Noted   Genetic testing 07/04/2022   Family history of breast cancer 06/28/2022   Family history of colon cancer 06/28/2022   Malignant neoplasm of upper-outer quadrant of right breast in female, estrogen receptor positive (HCC) 06/26/2022   Ovarian cyst    Reflux    Arthritis    Insulin resistance    Empty sella syndrome (HCC)    H/O: vasectomy     REFERRING DIAG: right breast cancer at risk for lymphedema  THERAPY DIAG:  Aftercare following surgery for  neoplasm  Malignant neoplasm of upper-outer quadrant of right breast in female, estrogen receptor positive (HCC)  PERTINENT HISTORY: Patient was diagnosed on 06/07/2022 with right grade 2 invasive ductal carcinoma breast cancer. It measures 1.8 cm and is located in the upper outer quadrant. It is ER/PR positive and HER negative with a Ki67 of 10%. She had a right lumpectomy with SLNB on 07/14/2022. Her Oncotype is 14. She will have her radiation simulation today and will have 4 weeks of radiation.   PRECAUTIONS: right UE Lymphedema risk, None  SUBJECTIVE: Pt returns for her 3 month L-Dex screen.   PAIN:  Are you having pain? No  SOZO SCREENING: Patient was assessed today using the SOZO machine to determine the lymphedema index score. This was compared to her baseline score. It was determined that she is within the recommended range when compared to her baseline and no further action is needed at this time. She will continue SOZO screenings. These are done every 3 months for 2 years post operatively followed by every 6 months for 2 years, and then annually.   L-DEX FLOWSHEETS - 06/05/23 1100       L-DEX LYMPHEDEMA SCREENING   Measurement Type Unilateral    L-DEX MEASUREMENT EXTREMITY Upper Extremity    POSITION  Standing  DOMINANT SIDE Right    At Risk Side Right    BASELINE SCORE (UNILATERAL) 5.1    L-DEX SCORE (UNILATERAL) 2.8    VALUE CHANGE (UNILAT) -2.3               Mayerly Kaman, Julieanne Manson, PT 06/05/2023, 11:42 AM

## 2023-06-25 ENCOUNTER — Encounter: Payer: Self-pay | Admitting: Hematology

## 2023-09-04 ENCOUNTER — Ambulatory Visit: Payer: BC Managed Care – PPO | Attending: Surgery

## 2023-09-04 VITALS — Wt 197.5 lb

## 2023-09-04 DIAGNOSIS — Z483 Aftercare following surgery for neoplasm: Secondary | ICD-10-CM | POA: Insufficient documentation

## 2023-09-04 NOTE — Therapy (Signed)
OUTPATIENT PHYSICAL THERAPY SOZO SCREENING NOTE   Patient Name: Janice Wagner MRN: [id number] DOB:1961-07-25, 62 y.o., female Today's Date: 09/04/2023  PCP: Gordan Payment., MD REFERRING PROVIDER: Abigail Miyamoto, MD   PT End of Session - 09/04/23 1005     Visit Number 2   # unchanged due to screen only   PT Start Time 0957    PT Stop Time 1001    PT Time Calculation (min) 4 min    Activity Tolerance Patient tolerated treatment well    Behavior During Therapy [facility] for tasks assessed/performed              Past Medical History:  Diagnosis Date   Arthritis    Empty sella syndrome (HCC)    Family history of breast cancer 06/28/2022   Family history of colon cancer 06/28/2022   GERD (gastroesophageal reflux disease)    H/O: vasectomy    husband with vasectomy   Hypertension    Insulin resistance    Migraines    Ovarian cyst    Reflux    Sleep apnea    has used CPAP, not currently   Past Surgical History:  Procedure Laterality Date   ABDOMINAL SURGERY     Laparotomy-Ovarian cyst   BREAST LUMPECTOMY WITH RADIOACTIVE SEED AND SENTINEL LYMPH NODE BIOPSY Right 07/14/2022   Procedure: RIGHT BREAST LUMPECTOMY WITH RADIOACTIVE SEED AND SENTINEL LYMPH NODE BIOPSY;  Surgeon: Abigail Miyamoto, MD;  Location: [facility];  Service: General;  Laterality: Right;   CHOLECYSTECTOMY     HERNIA REPAIR     VAGINAL HYSTERECTOMY  11/2002   VAGINAL HYST., POSTERIOR REPAIR   Patient Active Problem List   Diagnosis Date Noted   Genetic testing 07/04/2022   Family history of breast cancer 06/28/2022   Family history of colon cancer 06/28/2022   Malignant neoplasm of upper-outer quadrant of right breast in female, estrogen receptor positive (HCC) 06/26/2022   Ovarian cyst    Reflux    Arthritis    Insulin resistance    Empty sella syndrome (HCC)    H/O: vasectomy     REFERRING DIAG: right breast cancer at risk for lymphedema  THERAPY DIAG: Aftercare  following surgery for neoplasm  PERTINENT HISTORY: Patient was diagnosed on 06/07/2022 with right grade 2 invasive ductal carcinoma breast cancer. It measures 1.8 cm and is located in the upper outer quadrant. It is ER/PR positive and HER negative with a Ki67 of 10%. She had a right lumpectomy with SLNB on 07/14/2022. Her Oncotype is 14. She will have her radiation simulation today and will have 4 weeks of radiation.   PRECAUTIONS: right UE Lymphedema risk, None  SUBJECTIVE: Pt returns for her 3 month L-Dex screen.   PAIN:  Are you having pain? No  SOZO SCREENING: Patient was assessed today using the SOZO machine to determine the lymphedema index score. This was compared to her baseline score. It was determined that she is within the recommended range when compared to her baseline and no further action is needed at this time. She will continue SOZO screenings. These are done every 3 months for 2 years post operatively followed by every 6 months for 2 years, and then annually.   L-DEX FLOWSHEETS - 09/04/23 1000       L-DEX LYMPHEDEMA SCREENING   Measurement Type Unilateral    L-DEX MEASUREMENT EXTREMITY Upper Extremity    POSITION  Standing    DOMINANT SIDE Right    At Risk Side Right  BASELINE SCORE (UNILATERAL) 5.1    L-DEX SCORE (UNILATERAL) 4.2    VALUE CHANGE (UNILAT) -0.9             P: Cont every 3 month L-Dex screens until ~Oct 2025, then transition to every 6 months x 2 years.   Hermenia Bers, PTA 09/04/2023, 10:06 AM

## 2023-09-20 IMAGING — MR MR ABDOMEN WO/W CM
11 of 17 series · 28 of 48 positions shown · IV contrast (multihance)
Comparison: CT abdomen pelvis, 03/26/2009

CLINICAL DATA: Irritable bowel syndrome, chronic diarrhea,
abdominal pain

EXAM:
MRI ABDOMEN AND PELVIS WITHOUT AND WITH CONTRAST
TECHNIQUE: Multiplanar multisequence MR imaging of the abdomen and pelvis was
performed both before and after the administration of intravenous
contrast. Additional VoLumen negative oral enteric contrast was
administered.
CONTRAST:  20mL MULTIHANCE GADOBENATE DIMEGLUMINE 529 MG/ML IV SOLN,
additional negative oral enteric contrast

[Series 2: T2 · coronal · 5.0mm · 1.76mm/px · 3 of 46 slices shown (1 of 4)]
[im 1/46]
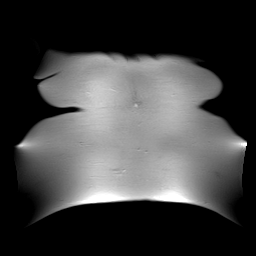
[im 23/46]
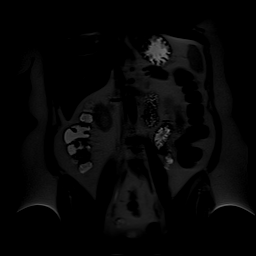
[im 46/46]
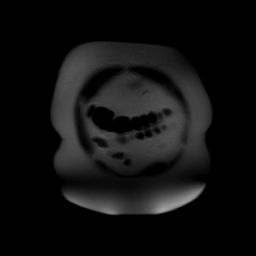

[Series 3: cor tru fisp · coronal · 4.0mm · 0.88mm/px · 2 of 47 slices shown (1 of 2)]
[im 1/47]
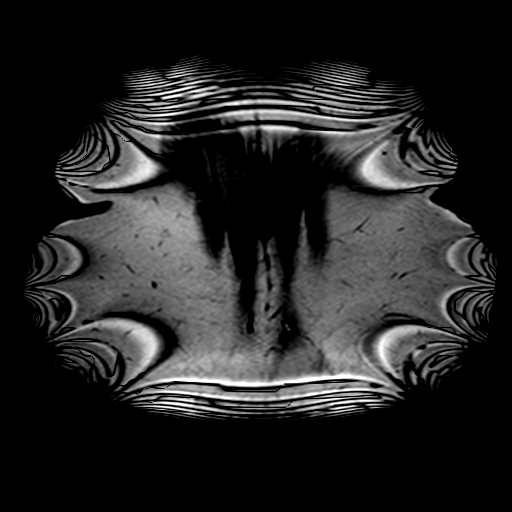
[im 47/47]
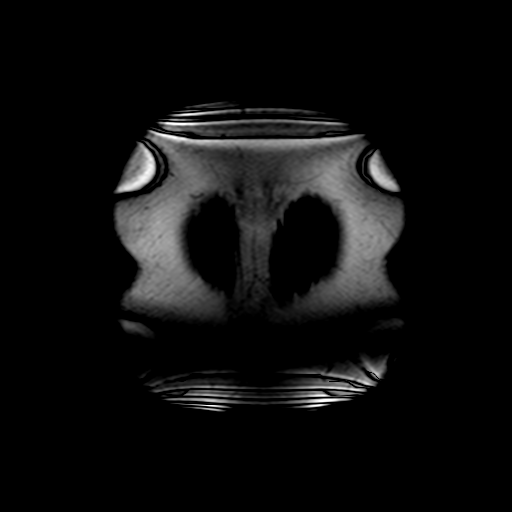

[Series 4: cor tru fisp · coronal · 4.0mm · 0.88mm/px · 2 of 47 slices shown (2 of 2)]
[im 1/47]
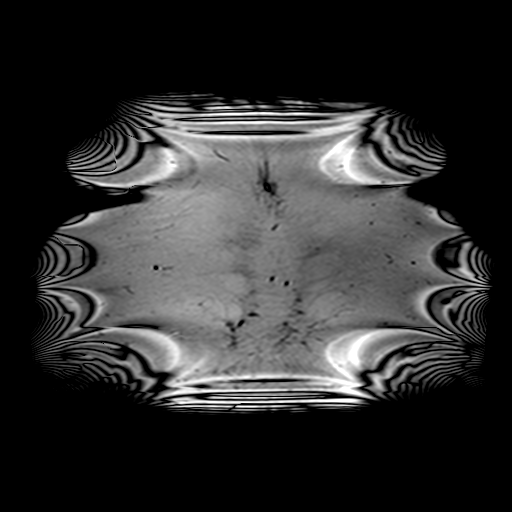
[im 47/47]
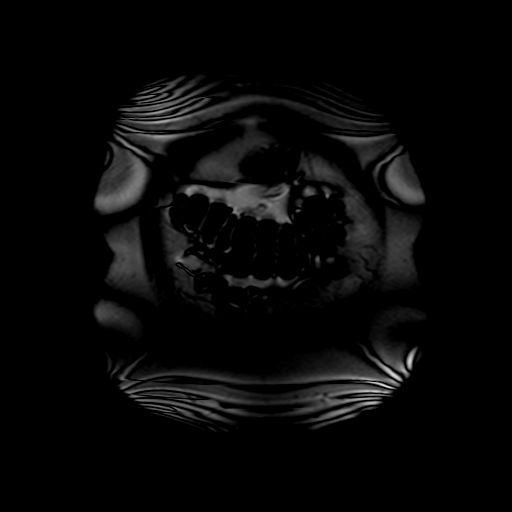

[Series 5: T2 · coronal · 5.0mm · 1.76mm/px · 2 of 46 slices shown (2 of 4)]
[im 1/46]
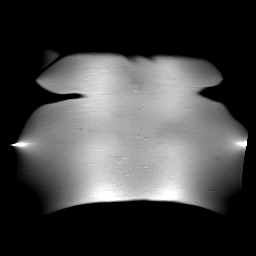
[im 46/46]
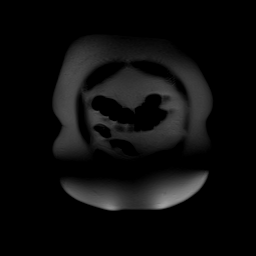

[Series 6: T2 · coronal · 4.0mm · 0.88mm/px · 2 of 48 slices shown (3 of 4)]
[im 1/48]
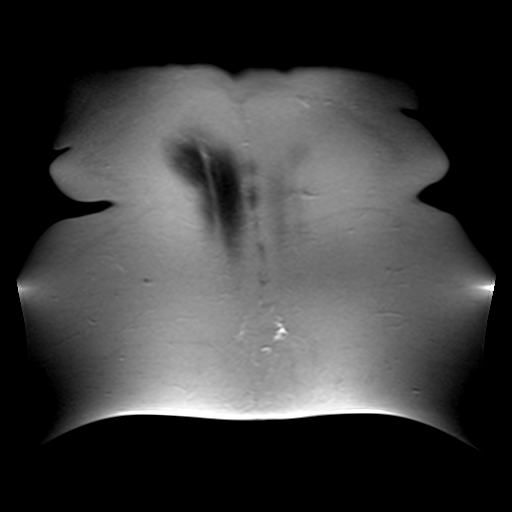
[im 48/48]
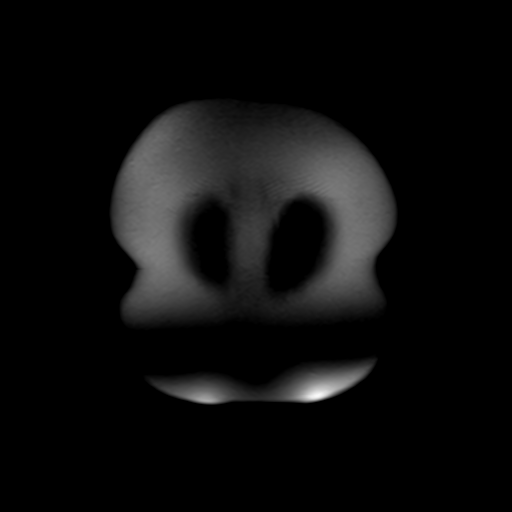

[Series 7: T2 · axial · 4.5mm · 0.88mm/px · z∈[-170,+170]mm · 2 of 59 slices shown (4 of 4)]
[im 1/59]
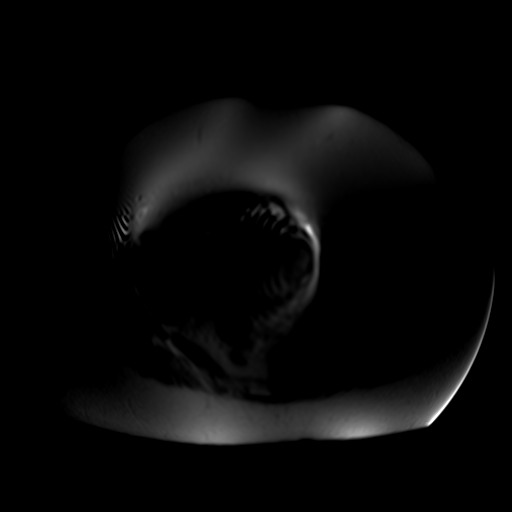
[im 59/59]
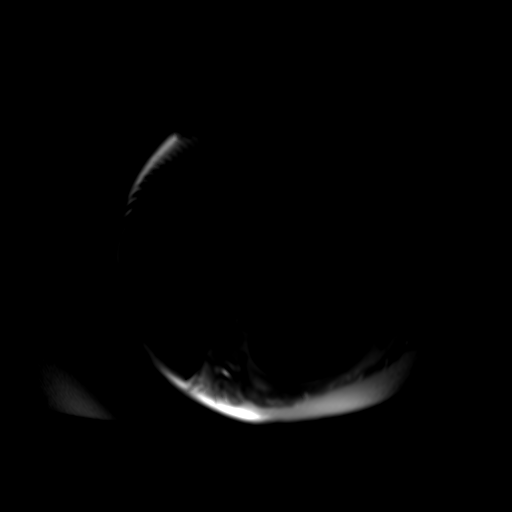

[Series 8: ep2d_diff_b50_500_800_p2 repeat · axial · 6.0mm · 2.34mm/px · z∈[-152,+185]mm · 6 of 152 slices shown]
[im 1/152]
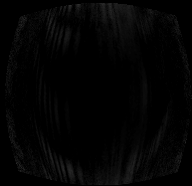
[im 31/152]
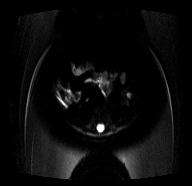
[im 61/152]
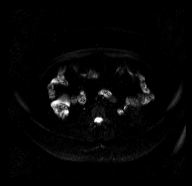
[im 91/152]
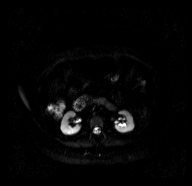
[im 121/152]
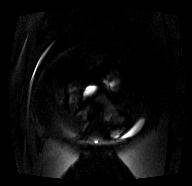
[im 152/152]
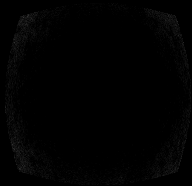

[Series 9: ep2d_diff_b50_500_800_p2 repeat_adc · axial · 6.0mm · 2.34mm/px · z∈[-152,+185]mm · 2 of 52 slices shown]
[im 1/52]
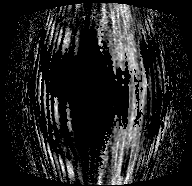
[im 52/52]
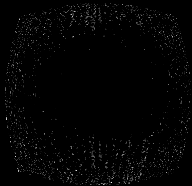

[Series 10: T1 dynamic · axial · non-contrast · 4.0mm · 0.88mm/px · z∈[-149,+167]mm · 3 of 80 slices shown]
[im 1/80]
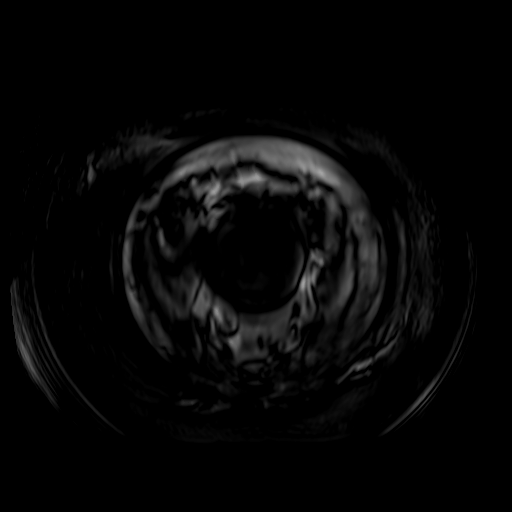
[im 40/80]
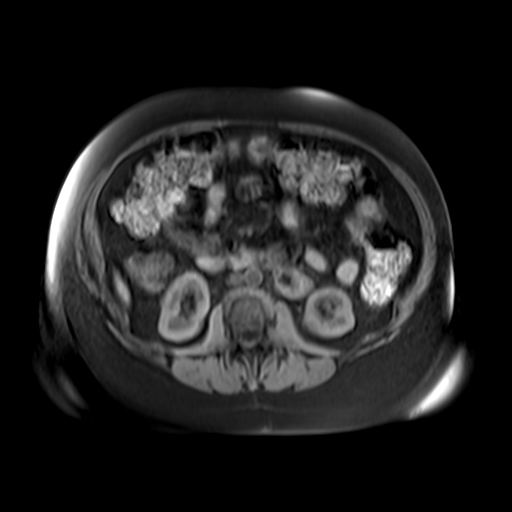
[im 80/80]
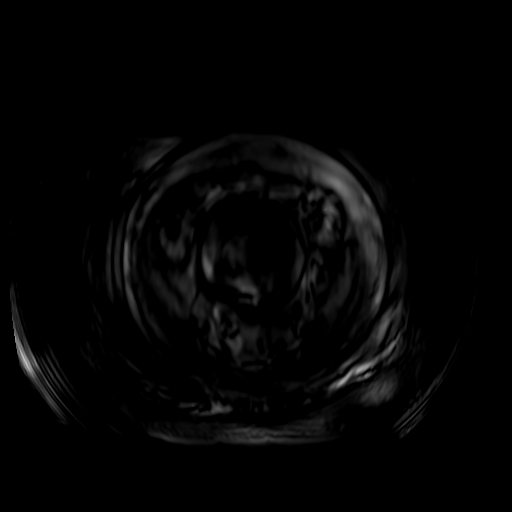

[Series 11: post 25 · axial · 4.0mm · 0.88mm/px · z∈[-149,+167]mm · 3 of 80 slices shown]
[im 1/80]
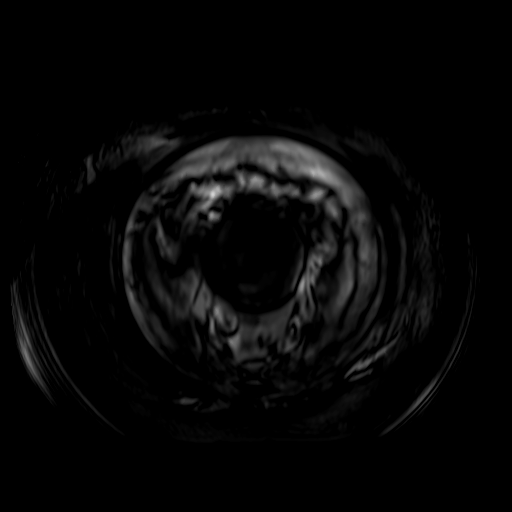
[im 40/80]
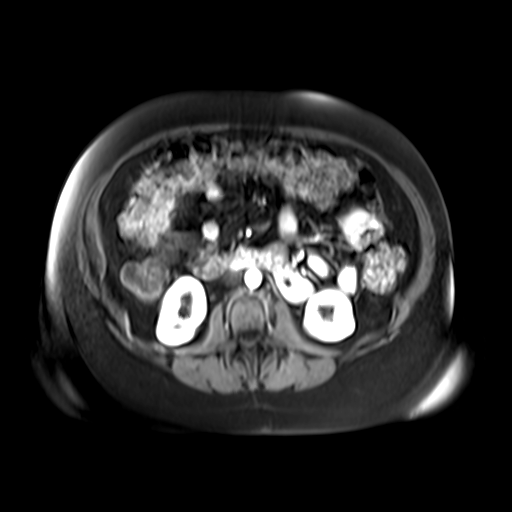
[im 80/80]
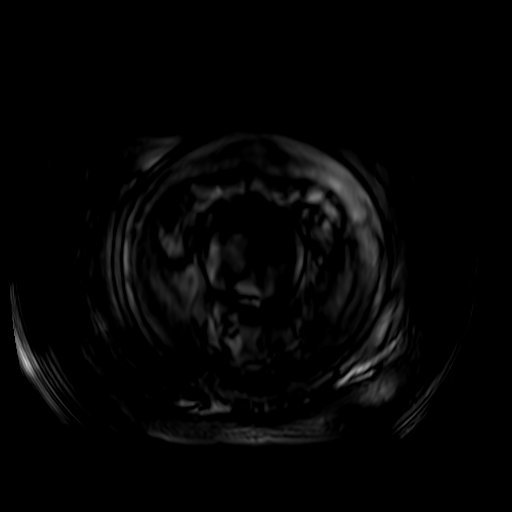

[Series 12: post 25_sub · axial · 4.0mm · 0.88mm/px · 1 of 80 slices shown]
[im 1/80]
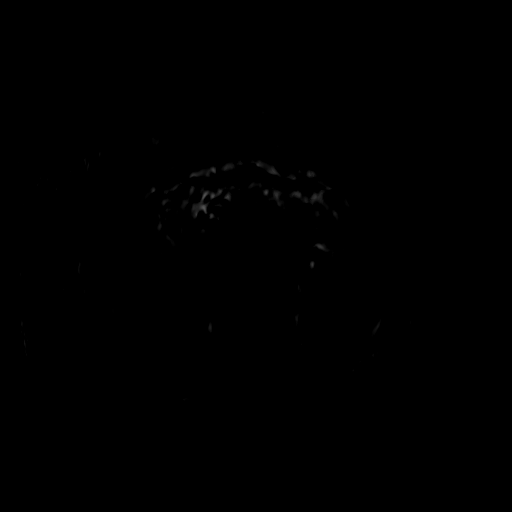

[28 of 48 positions shown; findings below may reference images not displayed]

FINDINGS: COMBINED FINDINGS FOR BOTH MR ABDOMEN AND PELVIS

Lower chest: No acute findings.

Hepatobiliary: There is a large, thin walled cyst of the left lobe
of the liver, hepatic segments II and III, measuring 7.2 x 6.2 cm
(series 11, image 10). Small, definitively benign hemangioma of the
right lobe of the liver demonstrating progressive peripheral nodular
contrast enhancement, measuring 1.5 x 1.5 cm cm (series 15, image
18). No solid mass or other parenchymal abnormality identified.
Status post cholecystectomy. No biliary ductal dilatation.

Pancreas: No mass, inflammatory changes, or other parenchymal
abnormality identified.No pancreatic ductal dilatation.

Spleen:  Within normal limits in size and appearance.

Adrenals/Urinary Tract: Normal adrenal glands. No renal masses or
suspicious contrast enhancement identified. No evidence of
hydronephrosis.

Stomach/Bowel: Visualized portions within the abdomen are
unremarkable. No evidence of obstruction. No inflammatory findings
of the bowel. Large burden of stool throughout the colon.

Vascular/Lymphatic: No pathologically enlarged lymph nodes
identified. No abdominal aortic aneurysm demonstrated.

Reproductive: Limited visualization of the low pelvis on this
examination tailored for enterography.

Other:  None.

Musculoskeletal: No suspicious osseous lesions identified.
IMPRESSION: 1. No MR abnormality of the bowel. No inflammatory findings or
evidence of obstruction.
2. No acute findings in the abdomen or pelvis.
3. Large, thin-walled cyst of the left lobe of the liver measuring
7.2 cm, which has significantly increased in size compared to prior
examination dated 03/26/2009. This may reflect either a simple
hepatic cyst or a biliary cystadenoma and may be symptomatic due to
size and mass effect.
4. Large burden of stool throughout the colon.

## 2023-09-20 IMAGING — MR MR LUMBAR SPINE W/O CM
4 of 5 series · 27 of 48 positions shown · non-contrast
Comparison: 09/21/2009

CLINICAL DATA: Low back pain radiating to both hips

EXAM:
MRI LUMBAR SPINE WITHOUT CONTRAST
TECHNIQUE: Multiplanar, multisequence MR imaging of the lumbar spine was
performed. No intravenous contrast was administered.

[Series 3: T2 · sagittal · 4.0mm · 0.53mm/px · 5 of 14 slices shown (1 of 2)]
[im 1/14]
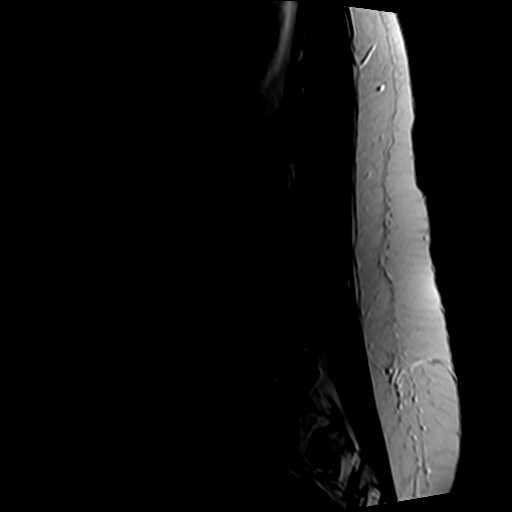
[im 4/14]
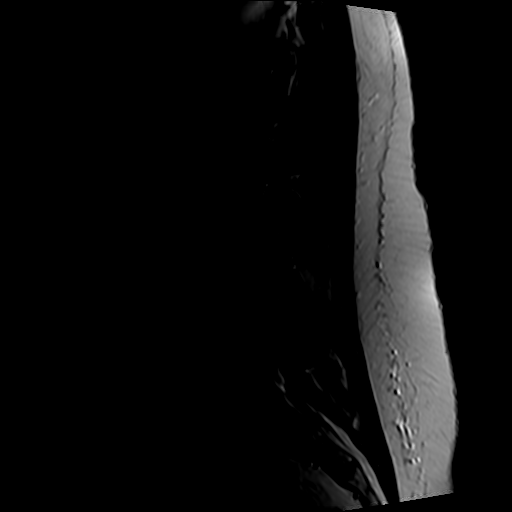
[im 7/14]
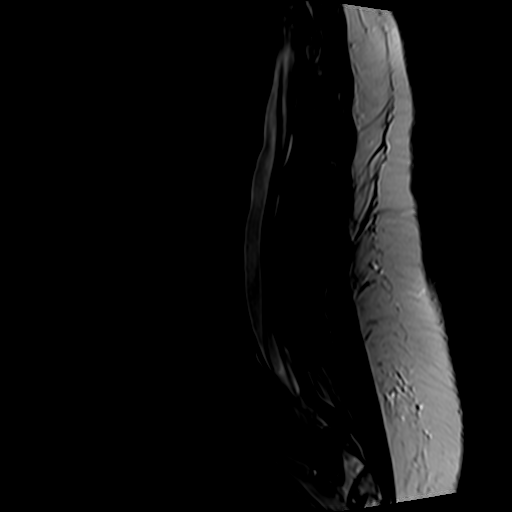
[im 10/14]
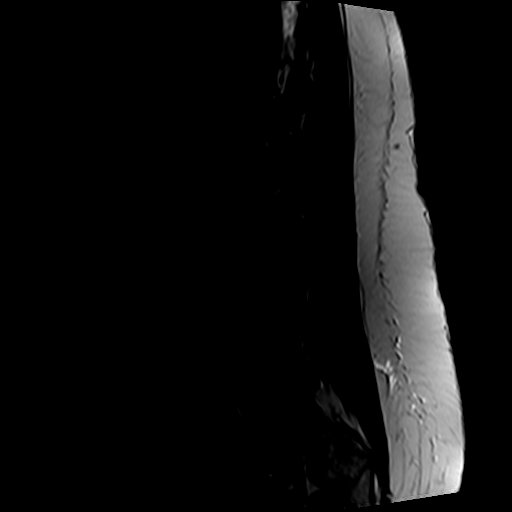
[im 14/14]
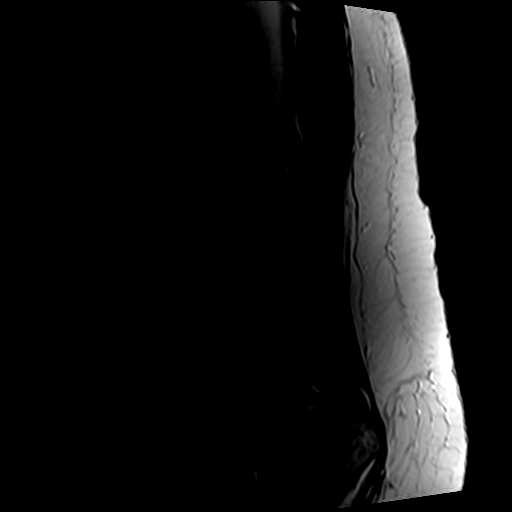

[Series 5: T1 · sagittal · 4.0mm · 0.53mm/px · 6 of 14 slices shown (1 of 2)]
[im 1/14]
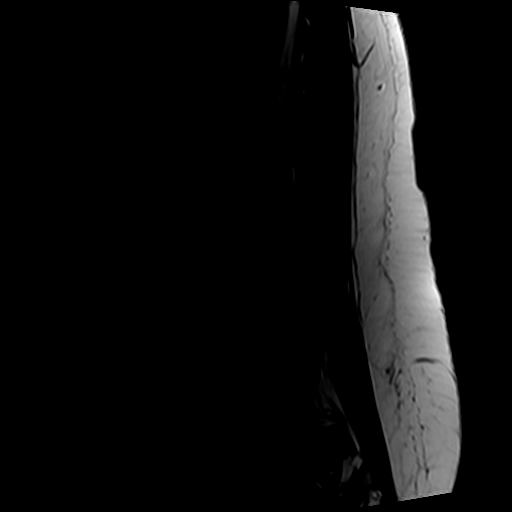
[im 3/14]
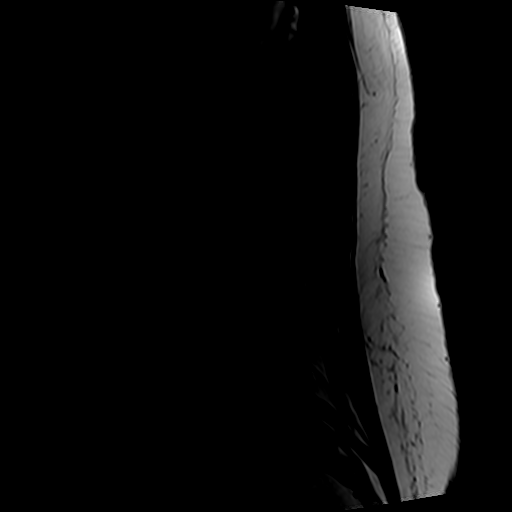
[im 6/14]
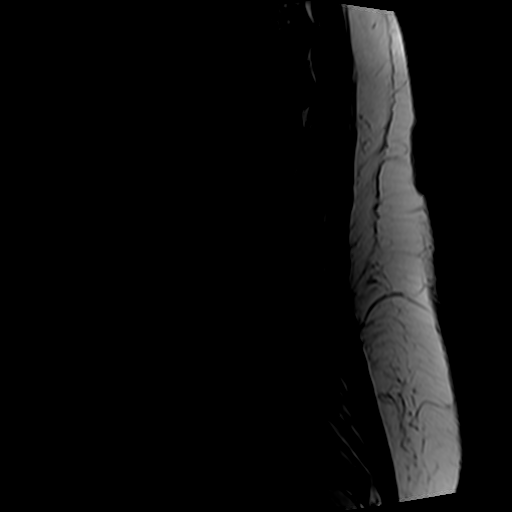
[im 8/14]
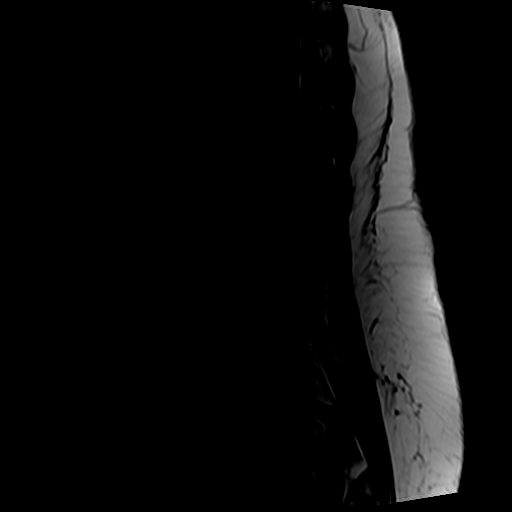
[im 11/14]
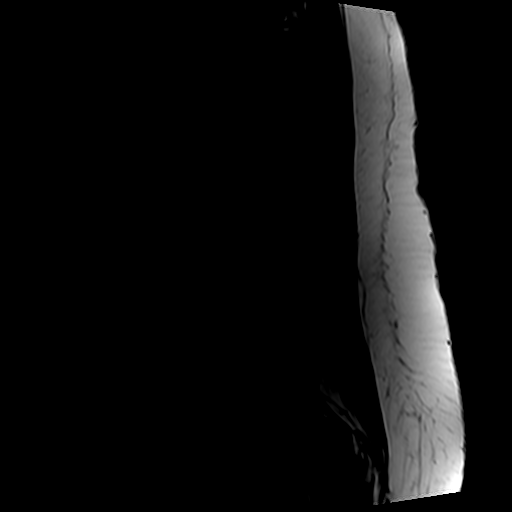
[im 14/14]
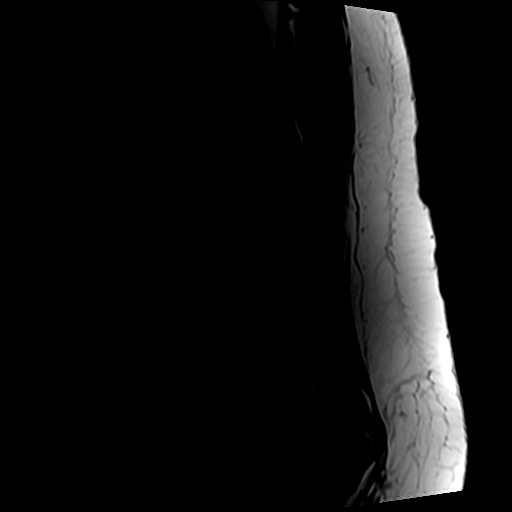

[Series 6: T2 · axial · 4.0mm · 0.70mm/px · z∈[-78,+128]mm · 10 of 39 slices shown (2 of 2)]
[im 3/39]
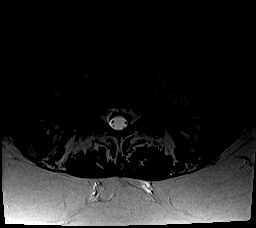
[im 6/39]
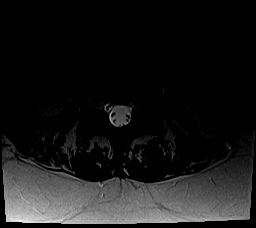
[im 8/39]
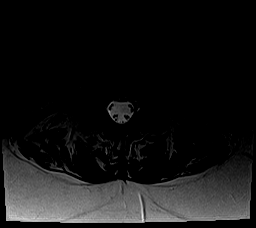
[im 13/39]
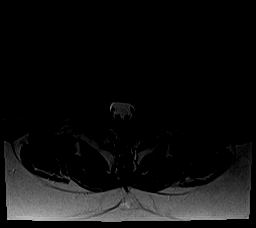
[im 18/39]
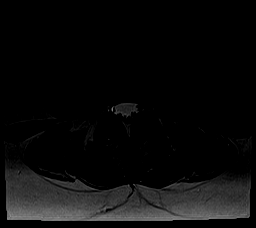
[im 21/39]
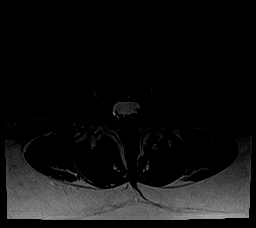
[im 23/39]
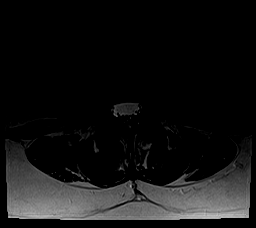
[im 28/39]
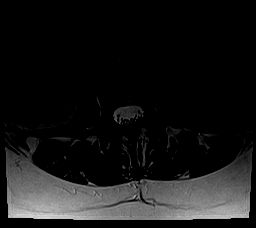
[im 33/39]
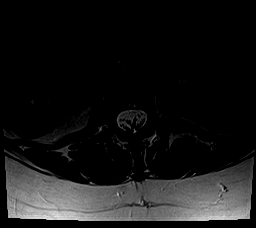
[im 39/39]
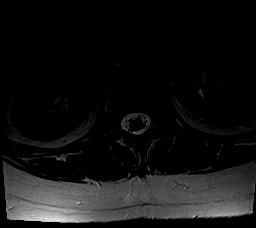

[Series 7: T1 · axial · 4.0mm · 0.35mm/px · z∈[-78,+98]mm · 6 of 39 slices shown (2 of 2)]
[im 3/39]
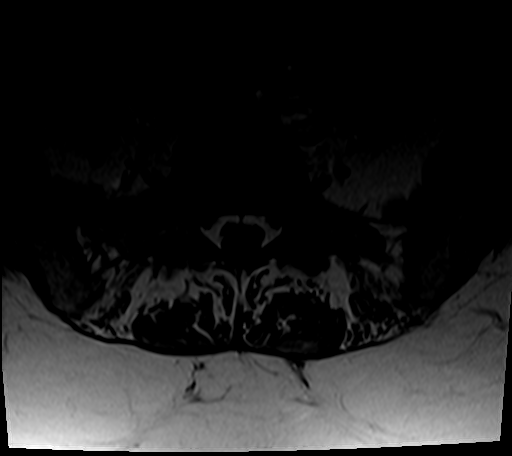
[im 6/39]
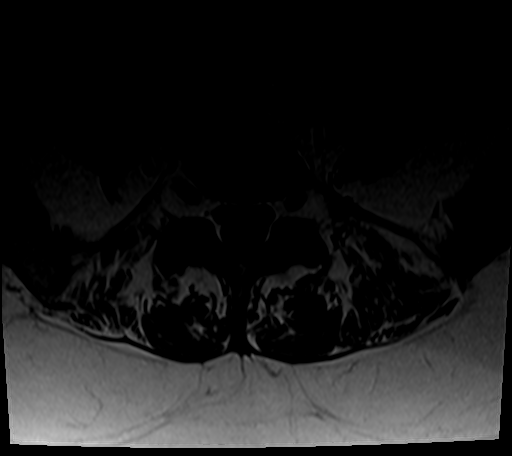
[im 8/39]
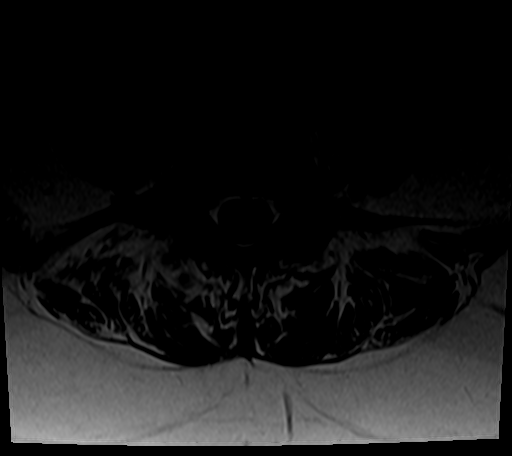
[im 13/39]
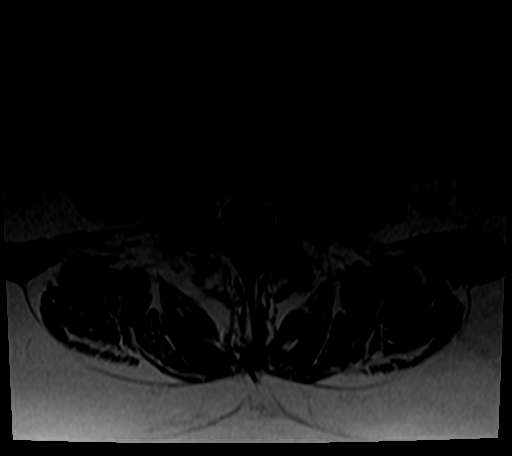
[im 21/39]
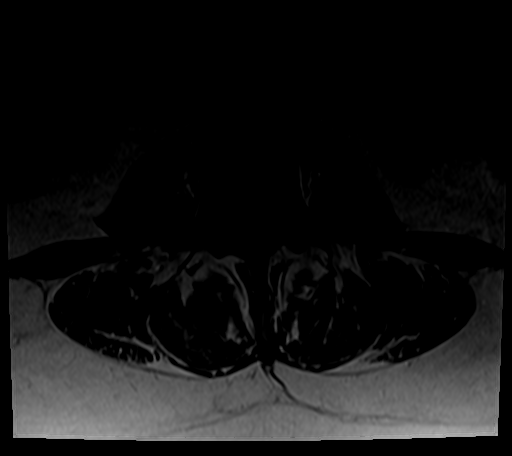
[im 33/39]
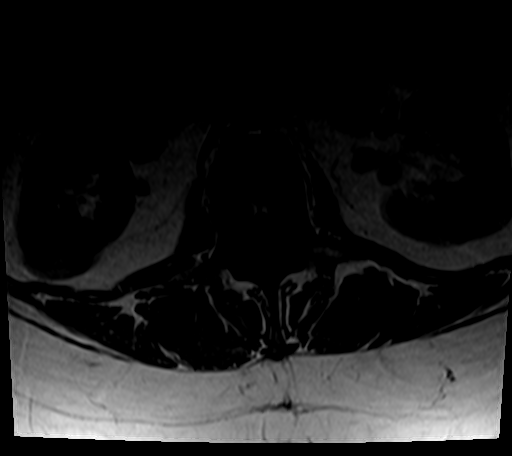

[27 of 48 positions shown; findings below may reference images not displayed]

FINDINGS: Segmentation:  Standard.

Alignment:  Physiologic.

Vertebrae:  No fracture, evidence of discitis, or bone lesion.

Conus medullaris and cauda equina: Conus extends to the L1 level.
Conus and cauda equina appear normal.

Paraspinal and other soft tissues: Negative.

Disc levels:

L1-L2: Normal disc space and facet joints. No spinal canal stenosis.
No neural foraminal stenosis.

L2-L3: Normal disc space and facet joints. No spinal canal stenosis.
No neural foraminal stenosis.

L3-L4: Normal disc space and facet joints. No spinal canal stenosis.
No neural foraminal stenosis.

L4-L5: Moderate facet hypertrophy, progressed. No spinal canal
stenosis. No neural foraminal stenosis.

L5-S1: Mild facet hypertrophy. No spinal canal stenosis. No neural
foraminal stenosis.

Visualized sacrum: Normal.
IMPRESSION: 1. Progression of moderate L4-5 and mild L5-S1 facet hypertrophy,
which may serve as a source of local low back pain.
2. No spinal canal or neural foraminal stenosis.

## 2023-11-08 NOTE — Progress Notes (Signed)
Patient Care Team: Gordan Payment., MD as PCP - General (Internal Medicine) Pershing Proud, RN as Oncology Nurse Navigator Donnelly Angelica, RN as Oncology Nurse Navigator Abigail Miyamoto, MD as Consulting Physician (General Surgery) Malachy Mood, MD as Consulting Physician (Hematology) Dorothy Puffer, MD as Consulting Physician (Radiation Oncology) Webb Silversmith, MD (Gastroenterology) Pollyann Samples, NP as Nurse Practitioner (Nurse Practitioner)   CHIEF COMPLAINT: Follow up right breast cancer   Oncology History Overview Note   Cancer Staging  Malignant neoplasm of upper-outer quadrant of right breast in female, estrogen receptor positive (HCC) Staging form: Breast, AJCC 8th Edition - Clinical: Stage IA (cT1c, cN0, cM0, G2, ER+, PR+, HER2-) - Signed by Ronny Bacon, PA-C on 06/26/2022 Stage prefix: Initial diagnosis Method of lymph node assessment: Clinical Histologic grading system: 3 grade system - Pathologic stage from 07/14/2022: Stage IA (pT1c, pN0, cM0, G2, ER+, PR+, HER2-) - Signed by Malachy Mood, MD on 09/10/2022 Stage prefix: Initial diagnosis Histologic grading system: 3 grade system Residual tumor (R): R0 - None     Malignant neoplasm of upper-outer quadrant of right breast in female, estrogen receptor positive (HCC)  06/19/2022 Initial Biopsy   Diagnosis 1. Breast, right, needle core biopsy, 12 o'clock, 2cmfn INVASIVE MODERATELY DIFFERENTIATED DUCTAL ADENOCARCINOMA, GRADE 2 (3+2+1) MICROCALCIFICATIONS PRESENT NEGATIVE FOR LYMPHOVASCULAR INVASION TUMOR MEASURES 6 MM IN GREATEST LINEAR EXTENT 2. Breast, right, needle core biopsy, 9:30 o'clock, 6cmfn BENIGN BREAST WITH FIBROCYSTIC CHANGES INCLUDING STROMAL FIBROSIS AND USUAL DUCT HYPERPLASIA PSEUDOANGIOMATOUS STROMAL HYPERPLASIA (PASH) NEGATIVE FOR MICROCALCIFICATIONS NEGATIVE FOR CARCINOMA 3. Breast, right, needle core biopsy, 10 o'clock, 8cmfn COMPATIBLE WITH A BENIGN FIBROADENOMA RARE  MICROCALCIFICATIONS PRESENT NEGATIVE FOR MALIGNANCY Diagnosis Note 1. An immunohistochemical stain for E-cadherin is performed with adequate control and is positive within the tumor supporting a ductal differentiation.  1. PROGNOSTIC INDICATORS Results: The tumor cells are equivocal for Her2 (2+). Her2 by FISH will be performed and the results reported separately. Estrogen Receptor: 100%, POSITIVE, STRONG STAINING INTENSITY Progesterone Receptor: 90%, POSITIVE, STRONG STAINING INTENSITY Proliferation Marker Ki67: 10%  1. FLUORESCENCE IN-SITU HYBRIDIZATION Results: GROUP 5: HER2 **NEGATIVE**   06/26/2022 Initial Diagnosis   Malignant neoplasm of upper-outer quadrant of right breast in female, estrogen receptor positive (HCC)   06/26/2022 Cancer Staging   Staging form: Breast, AJCC 8th Edition - Clinical: Stage IA (cT1c, cN0, cM0, G2, ER+, PR+, HER2-) - Signed by Ronny Bacon, PA-C on 06/26/2022 Stage prefix: Initial diagnosis Method of lymph node assessment: Clinical Histologic grading system: 3 grade system   07/07/2022 Genetic Testing   Negative hereditary cancer genetic testing: no pathogenic variants detected in [facility] CancerNext Expanded +RNAinsight Panel.  VUS in PTCH1 at p.P725R (c.2174C>G).   Report date is 07/07/2022.   The CancerNext-Expanded gene panel offered by [facility] and includes sequencing, rearrangement, and RNA analysis for the following 77 genes: AIP, ALK, APC, ATM, AXIN2, BAP1, BARD1, BLM, BMPR1A, BRCA1, BRCA2, BRIP1, CDC73, CDH1, CDK4, CDKN1B, CDKN2A, CHEK2, CTNNA1, DICER1, FANCC, FH, FLCN, GALNT12, KIF1B, LZTR1, MAX, MEN1, MET, MLH1, MSH2, MSH3, MSH6, MUTYH, NBN, NF1, NF2, NTHL1, PALB2, PHOX2B, PMS2, POT1, PRKAR1A, PTCH1, PTEN, RAD51C, RAD51D, RB1, RECQL, RET, SDHA, SDHAF2, SDHB, SDHC, SDHD, SMAD4, SMARCA4, SMARCB1, SMARCE1, STK11, SUFU, TMEM127, TP53, TSC1, TSC2, VHL and XRCC2 (sequencing and deletion/duplication); EGFR, EGLN1, HOXB13, KIT, MITF,  PDGFRA, POLD1, and POLE (sequencing only); EPCAM and GREM1 (deletion/duplication only).    07/14/2022 Cancer Staging   Staging form: Breast, AJCC 8th Edition - Pathologic stage from 07/14/2022: Stage IA (  pT1c, pN0, cM0, G2, ER+, PR+, HER2-) - Signed by Malachy Mood, MD on 09/10/2022 Stage prefix: Initial diagnosis Histologic grading system: 3 grade system Residual tumor (R): R0 - None    Imaging     07/14/2022 Oncotype testing   Recurrence Score: 14 4% distant recurrence risk at 9 years with AI/tamoxifen alone <1% chemo benefit   08/21/2022 - 09/15/2022 Radiation Therapy   Radiation Treatment Dates: 08/21/2022 through 09/15/2022 Site Technique Total Dose (Gy) Dose per Fx (Gy) Completed Fx Beam Energies  Breast, Right: Breast_R 3D 42.56/42.56 2.66 16/16 10X  Breast, Right: Breast_R_Bst specialPort 8/8 2 4/4 15E      02/06/2023 Survivorship   SCP delivered by Santiago Glad, NP      CURRENT THERAPY: Exemestane, starting 10/2022  INTERVAL HISTORY [name] returns for follow up as scheduled. Last seen by Dr. Mosetta Putt 05/10/23.  She is doing well overall with no significant changes in her health.  Denies breast concerns such as new lump/mass, nipple discharge or inversion, or skin change.  She continues to have soreness under the right arm if she does heavy lifting, but stable since surgery.  Tolerating exemestane very mild hot flashes and joint aches in her hands.  She is functioning well.  Denies new bone pain.  Losing weight intentionally on Ozempic. Gets occasional perineal abscess/boil, thinks she may have a cyst or something on her buttock which is in a different location and would like it to be examined. Denies changes in her bowel habits or rectal bleeding.   ROS  All other systems reviewed and negative  Past Medical History:  Diagnosis Date   Arthritis    Empty sella syndrome (HCC)    Family history of breast cancer 06/28/2022   Family history of colon cancer 06/28/2022   GERD  (gastroesophageal reflux disease)    H/O: vasectomy    husband with vasectomy   Hypertension    Insulin resistance    Migraines    Ovarian cyst    Reflux    Sleep apnea    has used CPAP, not currently     Past Surgical History:  Procedure Laterality Date   ABDOMINAL SURGERY     Laparotomy-Ovarian cyst   BREAST LUMPECTOMY WITH RADIOACTIVE SEED AND SENTINEL LYMPH NODE BIOPSY Right 07/14/2022   Procedure: RIGHT BREAST LUMPECTOMY WITH RADIOACTIVE SEED AND SENTINEL LYMPH NODE BIOPSY;  Surgeon: Abigail Miyamoto, MD;  Location: [facility];  Service: General;  Laterality: Right;   CHOLECYSTECTOMY     HERNIA REPAIR     VAGINAL HYSTERECTOMY  11/2002   VAGINAL HYST., POSTERIOR REPAIR     Outpatient Encounter Medications as of 11/12/2023  Medication Sig Note   amLODipine (NORVASC) 5 MG tablet Take 5 mg by mouth daily.    celecoxib (CELEBREX) 200 MG capsule Take 200 mg by mouth daily.    Cetirizine HCl (ZYRTEC ALLERGY PO) Take by mouth.    DULoxetine (CYMBALTA) 30 MG capsule Take 30 mg by mouth daily.    famotidine (PEPCID) 40 MG tablet Take 40 mg by mouth 2 (two) times daily.    lisinopril (ZESTRIL) 20 MG tablet Take 20 mg by mouth daily.    metFORMIN (GLUCOPHAGE-XR) 500 MG 24 hr tablet Take 500 mg by mouth 2 (two) times daily.    ondansetron (ZOFRAN) 4 MG tablet Take 1 tablet (4 mg total) by mouth every 8 (eight) hours as needed for nausea or vomiting.    ondansetron (ZOFRAN-ODT) 4 MG disintegrating tablet Take 4 mg  by mouth every 8 (eight) hours as needed for nausea or vomiting.    OZEMPIC, 0.25 OR 0.5 MG/DOSE, 2 MG/3ML SOPN Inject 0.5mg  once a week    topiramate (TOPAMAX) 25 MG tablet Take 25 mg by mouth daily.    [DISCONTINUED] exemestane (AROMASIN) 25 MG tablet Take 1 tablet (25 mg total) by mouth daily after breakfast.    exemestane (AROMASIN) 25 MG tablet Take 1 tablet (25 mg total) by mouth daily after breakfast.    OZEMPIC, 1 MG/DOSE, 4 MG/3ML SOPN Inject into the  skin. (Patient not taking: Reported on 11/12/2023) 11/12/2023: Starting new dose today   No facility-administered encounter medications on file as of 11/12/2023.     Today's Vitals   11/12/23 1055 11/12/23 1058  BP: (!) 150/70   Pulse: (!) 58   Resp: 15   Temp: 97.8 F (36.6 C)   SpO2: 99%   Weight: 196 lb 8 oz (89.1 kg)   PainSc:  0-No pain   Body mass index is 33.21 kg/m.   PHYSICAL EXAM GENERAL:alert, no distress and comfortable SKIN: no rash  EYES: sclera clear NECK: without mass LYMPH:  no palpable cervical or supraclavicular lymphadenopathy  LUNGS: clear with normal breathing effort HEART: regular rate & rhythm, no lower extremity edema ABDOMEN: abdomen soft, non-tender and normal bowel sounds Rectal: external exam reveals small erythematous cystic lesion on the right perianal area.  NEURO: alert & oriented x 3 with fluent speech, no focal motor/sensory deficits Breast exam: No nipple discharge or inversion.  S/p right lumpectomy, incisions completely healed.  No palpable mass or nodularity in either breast or axilla that I could appreciate.    CBC    Component Value Date/Time   WBC 7.2 11/12/2023 1039   WBC 7.2 05/10/2023 1337   RBC 4.82 11/12/2023 1039   HGB 13.6 11/12/2023 1039   HCT 42.5 11/12/2023 1039   PLT 348 11/12/2023 1039   MCV 88.2 11/12/2023 1039   MCH 28.2 11/12/2023 1039   MCHC 32.0 11/12/2023 1039   RDW 14.2 11/12/2023 1039   LYMPHSABS 1.1 11/12/2023 1039   MONOABS 0.7 11/12/2023 1039   EOSABS 0.1 11/12/2023 1039   BASOSABS 0.1 11/12/2023 1039     CMP     Component Value Date/Time   NA 139 11/12/2023 1039   K 4.0 11/12/2023 1039   CL 105 11/12/2023 1039   CO2 28 11/12/2023 1039   GLUCOSE 94 11/12/2023 1039   BUN 11 11/12/2023 1039   CREATININE 0.96 11/12/2023 1039   CALCIUM 9.5 11/12/2023 1039   PROT 6.9 11/12/2023 1039   ALBUMIN 4.4 11/12/2023 1039   AST 17 11/12/2023 1039   ALT 11 11/12/2023 1039   ALKPHOS 91 11/12/2023 1039    BILITOT 0.7 11/12/2023 1039   GFRNONAA >60 11/12/2023 1039     ASSESSMENT & PLAN: 63 yo female  Screening detected malignant neoplasm of upper-outer quadrant of right breast in female, estrogen receptor positive; Stage IA, p(T1c, N0), ER+/PR+/HER2-, Grade 2  -Diagnosed 05/2022 -S/p lumpectomy and SLNB on 07/14/22 and adjuvant radiation completing 09/15/22. - Oncotype recurrence score 14, low risk disease, chemotherapy is not recommended. -she started adjuvant exemestane in Feb 2024, tolerating well with mild hot flashes and joint aches.  -Mammo 05/2023 was benign -Ms. Streiff is clinically doing well, tolerating AI.  Exam is benign, labs are normal.  Overall no clinical concern for breast cancer recurrence.  Continue surveillance and exemestane, I refilled -Recommend warm warm soaks/sitz bath for perineal lesion/boil -Follow-up  in 6 months, or sooner if needed   PLAN: -Recent mammo and today's labs reviewed -Continue breast cancer surveillance and exemestane, refilled  -Recommend warm warm soaks/sitz bath for perineal lesion -Follow-up in 6 months, or sooner if needed  Orders Placed This Encounter  Procedures   MM DIAG BREAST TOMO BILATERAL    Standing Status:   Future    Expected Date:   06/05/2024    Expiration Date:   11/11/2024    Scheduling Instructions:     [facility]    Reason for Exam (SYMPTOM  OR DIAGNOSIS REQUIRED):   H/o R breast cancer    Preferred imaging location?:   External      All questions were answered. The patient knows to call the clinic with any problems, questions or concerns. No barriers to learning were detected.  Santiago Glad, NP-C 11/12/2023

## 2023-11-12 ENCOUNTER — Inpatient Hospital Stay (HOSPITAL_BASED_OUTPATIENT_CLINIC_OR_DEPARTMENT_OTHER): Payer: 59 | Admitting: Nurse Practitioner

## 2023-11-12 ENCOUNTER — Other Ambulatory Visit: Payer: Self-pay

## 2023-11-12 ENCOUNTER — Inpatient Hospital Stay: Payer: 59 | Attending: Nurse Practitioner

## 2023-11-12 ENCOUNTER — Encounter: Payer: Self-pay | Admitting: Nurse Practitioner

## 2023-11-12 VITALS — BP 150/70 | HR 58 | Temp 97.8°F | Resp 15 | Wt 196.5 lb

## 2023-11-12 DIAGNOSIS — C50411 Malignant neoplasm of upper-outer quadrant of right female breast: Secondary | ICD-10-CM | POA: Diagnosis not present

## 2023-11-12 DIAGNOSIS — Z79811 Long term (current) use of aromatase inhibitors: Secondary | ICD-10-CM | POA: Insufficient documentation

## 2023-11-12 DIAGNOSIS — Z1732 Human epidermal growth factor receptor 2 negative status: Secondary | ICD-10-CM | POA: Diagnosis not present

## 2023-11-12 DIAGNOSIS — Z923 Personal history of irradiation: Secondary | ICD-10-CM | POA: Insufficient documentation

## 2023-11-12 DIAGNOSIS — Z170 Estrogen receptor positive status [ER+]: Secondary | ICD-10-CM | POA: Insufficient documentation

## 2023-11-12 DIAGNOSIS — R232 Flushing: Secondary | ICD-10-CM | POA: Diagnosis not present

## 2023-11-12 DIAGNOSIS — Z1721 Progesterone receptor positive status: Secondary | ICD-10-CM | POA: Insufficient documentation

## 2023-11-12 LAB — CMP (CANCER CENTER ONLY)
ALT: 11 U/L (ref 0–44)
AST: 17 U/L (ref 15–41)
Albumin: 4.4 g/dL (ref 3.5–5.0)
Alkaline Phosphatase: 91 U/L (ref 38–126)
Anion gap: 6 (ref 5–15)
BUN: 11 mg/dL (ref 8–23)
CO2: 28 mmol/L (ref 22–32)
Calcium: 9.5 mg/dL (ref 8.9–10.3)
Chloride: 105 mmol/L (ref 98–111)
Creatinine: 0.96 mg/dL (ref 0.44–1.00)
GFR, Estimated: >60 mL/min (ref >60)
Glucose, Bld: 94 mg/dL (ref 70–99)
Potassium: 4 mmol/L (ref 3.5–5.1)
Sodium: 139 mmol/L (ref 135–145)
Total Bilirubin: 0.7 mg/dL (ref 0.0–1.2)
Total Protein: 6.9 g/dL (ref 6.5–8.1)

## 2023-11-12 LAB — CBC WITH DIFFERENTIAL (CANCER CENTER ONLY)
Abs Immature Granulocytes: 0.03 10*3/uL (ref 0.00–0.07)
Basophils Absolute: 0.1 10*3/uL (ref 0.0–0.1)
Basophils Relative: 1 %
Eosinophils Absolute: 0.1 10*3/uL (ref 0.0–0.5)
Eosinophils Relative: 1 %
HCT: 42.5 % (ref 36.0–46.0)
Hemoglobin: 13.6 g/dL (ref 12.0–15.0)
Immature Granulocytes: 0 %
Lymphocytes Relative: 15 %
Lymphs Abs: 1.1 10*3/uL (ref 0.7–4.0)
MCH: 28.2 pg (ref 26.0–34.0)
MCHC: 32 g/dL (ref 30.0–36.0)
MCV: 88.2 fL (ref 80.0–100.0)
Monocytes Absolute: 0.7 10*3/uL (ref 0.1–1.0)
Monocytes Relative: 10 %
Neutro Abs: 5.2 10*3/uL (ref 1.7–7.7)
Neutrophils Relative %: 73 %
Platelet Count: 348 10*3/uL (ref 150–400)
RBC: 4.82 MIL/uL (ref 3.87–5.11)
RDW: 14.2 % (ref 11.5–15.5)
WBC Count: 7.2 10*3/uL (ref 4.0–10.5)
nRBC: 0 % (ref 0.0–0.2)

## 2023-11-12 MED ORDER — EXEMESTANE 25 MG PO TABS
25.0000 mg | ORAL_TABLET | Freq: Every day | ORAL | 1 refills | Status: DC
Start: 2023-11-12 — End: 2024-05-05

## 2023-12-17 ENCOUNTER — Ambulatory Visit: Payer: 59 | Attending: Surgery

## 2023-12-17 VITALS — Wt 190.2 lb

## 2023-12-17 DIAGNOSIS — Z483 Aftercare following surgery for neoplasm: Secondary | ICD-10-CM | POA: Insufficient documentation

## 2023-12-17 NOTE — Therapy (Signed)
 OUTPATIENT PHYSICAL THERAPY SOZO SCREENING NOTE   Patient Name: Janice Wagner MRN: [id number] DOB:October 04, 1960, 63 y.o., female Today's Date: 12/17/2023  PCP: Gordan Payment., MD REFERRING PROVIDER: Abigail Miyamoto, MD   PT End of Session - 12/17/23 1002     Visit Number 2   # unchanged due to screen only   PT Start Time 1000    PT Stop Time 1004    PT Time Calculation (min) 4 min    Activity Tolerance Patient tolerated treatment well    Behavior During Therapy WFL for tasks assessed/performed              Past Medical History:  Diagnosis Date   Arthritis    Empty sella syndrome (HCC)    Family history of breast cancer 06/28/2022   Family history of colon cancer 06/28/2022   GERD (gastroesophageal reflux disease)    H/O: vasectomy    husband with vasectomy   Hypertension    Insulin resistance    Migraines    Ovarian cyst    Reflux    Sleep apnea    has used CPAP, not currently   Past Surgical History:  Procedure Laterality Date   ABDOMINAL SURGERY     Laparotomy-Ovarian cyst   BREAST LUMPECTOMY WITH RADIOACTIVE SEED AND SENTINEL LYMPH NODE BIOPSY Right 07/14/2022   Procedure: RIGHT BREAST LUMPECTOMY WITH RADIOACTIVE SEED AND SENTINEL LYMPH NODE BIOPSY;  Surgeon: Abigail Miyamoto, MD;  Location: [facility];  Service: General;  Laterality: Right;   CHOLECYSTECTOMY     HERNIA REPAIR     VAGINAL HYSTERECTOMY  11/2002   VAGINAL HYST., POSTERIOR REPAIR   Patient Active Problem List   Diagnosis Date Noted   Genetic testing 07/04/2022   Family history of breast cancer 06/28/2022   Family history of colon cancer 06/28/2022   Malignant neoplasm of upper-outer quadrant of right breast in female, estrogen receptor positive (HCC) 06/26/2022   Ovarian cyst    Reflux    Arthritis    Insulin resistance    Empty sella syndrome (HCC)    H/O: vasectomy     REFERRING DIAG: right breast cancer at risk for lymphedema  THERAPY DIAG: Aftercare following  surgery for neoplasm  PERTINENT HISTORY: Patient was diagnosed on 06/07/2022 with right grade 2 invasive ductal carcinoma breast cancer. It measures 1.8 cm and is located in the upper outer quadrant. It is ER/PR positive and HER negative with a Ki67 of 10%. She had a right lumpectomy with SLNB on 07/14/2022. Her Oncotype is 14. She will have her radiation simulation today and will have 4 weeks of radiation.   PRECAUTIONS: right UE Lymphedema risk, None  SUBJECTIVE: Pt returns for her 3 month L-Dex screen.   PAIN:  Are you having pain? No  SOZO SCREENING: Patient was assessed today using the SOZO machine to determine the lymphedema index score. This was compared to her baseline score. It was determined that she is within the recommended range when compared to her baseline and no further action is needed at this time. She will continue SOZO screenings. These are done every 3 months for 2 years post operatively followed by every 6 months for 2 years, and then annually.   L-DEX FLOWSHEETS - 12/17/23 1000       L-DEX LYMPHEDEMA SCREENING   Measurement Type Unilateral    L-DEX MEASUREMENT EXTREMITY Upper Extremity    POSITION  Standing    DOMINANT SIDE Right    At Risk Side Right  BASELINE SCORE (UNILATERAL) 5.1    L-DEX SCORE (UNILATERAL) 1.7    VALUE CHANGE (UNILAT) -3.4             P: Cont every 3 month L-Dex screens until ~Oct 2025, then transition to every 6 months x 2 years.   Hermenia Bers, PTA 12/17/2023, 10:03 AM

## 2024-01-02 ENCOUNTER — Ambulatory Visit: Admitting: Primary Care

## 2024-01-02 ENCOUNTER — Encounter: Payer: Self-pay | Admitting: Primary Care

## 2024-01-02 VITALS — BP 115/77 | HR 71 | Temp 98.5°F | Ht 64.0 in | Wt 189.8 lb

## 2024-01-02 DIAGNOSIS — G4710 Hypersomnia, unspecified: Secondary | ICD-10-CM | POA: Diagnosis not present

## 2024-01-02 DIAGNOSIS — R0683 Snoring: Secondary | ICD-10-CM | POA: Diagnosis not present

## 2024-01-02 NOTE — Progress Notes (Signed)
 home  @Patient  ID: Janice Wagner, female    DOB: 03/19/1961, 63 y.o.   MRN: [id number]  Chief Complaint  Patient presents with   Follow-up    OSA Consult- Snoring, waking up throughout the night    Referring provider: Gordan Payment., MD  HPI: 63 year old female, never smoked.  Past medical history significant for insulin resistance, OSA, breast cancer.  01/02/2024 Discussed the use of AI scribe software for clinical note transcription with the patient, who gave verbal consent to proceed.  History of Present Illness   Janice Wagner is a 63 year old female with a history of sleep apnea who presents for a sleep consult.  She has a history of sleep apnea and underwent a sleep study approximately fifteen years ago. She used a CPAP machine regularly for about five years but discontinued its use because she felt she needed more air and did not get it adjusted. Although the CPAP was beneficial, she stopped using it.  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 Additionally, she uses Invisalign for her bottom teeth. Without these devices, she snores more and sometimes wakes herself up snoring, especially when sleeping on her back or on the couch. Her granddaughter has also noted her snoring. She does not consume alcohol before bed and describes her sleep position as variable, switching between her sides and back.  She experiences daytime sleepiness, which was more pronounced when she was working. Now retired, she feels less pressure to stay awake during the day. Her bedtime is around 10 or 11 PM, and she wakes up three to four times a night to use the restroom, getting out of bed by 10 AM.  Her weight is roughly the same as it was fifteen years ago, although she has experienced some weight loss recently while on Ozempic. She has not noticed a significant change in her snoring with the weight loss.  No history of seizures, cardiac  issues such as heart failure or heart attack, and does not have COPD or use oxygen. No significant alcohol use before bedtime.      Allergies  Allergen Reactions   Penicillins     Immunization History  Administered Date(s) Administered   Influenza Split 07/09/2015, 06/27/2017   Influenza,inj,Quad PF,6+ Mos 06/29/2021, 06/22/2022   Influenza-Unspecified 06/21/2018, 06/24/2020, 07/19/2020   Moderna Sars-Covid-2 Vaccination 11/20/2019, 12/22/2019, 09/02/2020, 08/12/2021   Tdap 09/26/2011, 04/12/2018   Zoster, Live 10/17/2019    Past Medical History:  Diagnosis Date   Arthritis    Empty sella syndrome (HCC)    Family history of breast cancer 06/28/2022   Family history of colon cancer 06/28/2022   GERD (gastroesophageal reflux disease)    H/O: vasectomy    husband with vasectomy   Hypertension    Insulin resistance    Migraines    Ovarian cyst    Reflux    Sleep apnea    has used CPAP, not currently    Tobacco History: Social History   Tobacco Use  Smoking Status Never  Smokeless Tobacco Never   Counseling given: Not Answered   Outpatient Medications Prior to Visit  Medication Sig Dispense Refill   amLODipine (NORVASC) 5 MG tablet Take 5 mg by mouth daily.     celecoxib (CELEBREX) 200 MG capsule Take 200 mg by mouth daily.     Cetirizine HCl (ZYRTEC ALLERGY PO) Take by mouth.     DULoxetine (CYMBALTA) 30 MG capsule Take 30  mg by mouth daily.     exemestane (AROMASIN) 25 MG tablet Take 1 tablet (25 mg total) by mouth daily after breakfast. 90 tablet 1   famotidine (PEPCID) 40 MG tablet Take 40 mg by mouth 2 (two) times daily.     lisinopril (ZESTRIL) 20 MG tablet Take 20 mg by mouth daily.     metFORMIN (GLUCOPHAGE-XR) 500 MG 24 hr tablet Take 500 mg by mouth 2 (two) times daily.     ondansetron (ZOFRAN) 4 MG tablet Take 1 tablet (4 mg total) by mouth every 8 (eight) hours as needed for nausea or vomiting. 20 tablet 0   ondansetron (ZOFRAN-ODT) 4 MG disintegrating  tablet Take 4 mg by mouth every 8 (eight) hours as needed for nausea or vomiting.     OZEMPIC, 0.25 OR 0.5 MG/DOSE, 2 MG/3ML SOPN Inject 0.5mg  once a week     OZEMPIC, 1 MG/DOSE, 4 MG/3ML SOPN Inject into the skin.     topiramate (TOPAMAX) 25 MG tablet Take 25 mg by mouth daily.     No facility-administered medications prior to visit.   Review of Systems  Review of Systems  Constitutional: Negative.   HENT: Negative.    Respiratory: Negative.     Physical Exam  BP 115/77 (BP Location: Left Arm, Patient Position: Sitting, Cuff Size: Large)   Pulse 71   Temp 98.5 F (36.9 C) (Oral)   Ht 5\' 4"  (1.626 m)   Wt 189 lb 12.8 oz (86.1 kg)   SpO2 98%   BMI 32.58 kg/m  Physical Exam Constitutional:      Appearance: Normal appearance. She is not ill-appearing.  HENT:     Head: Normocephalic and atraumatic.     Mouth/Throat:     Mouth: Mucous membranes are moist.     Pharynx: Oropharynx is clear.  Cardiovascular:     Rate and Rhythm: Normal rate and regular rhythm.  Pulmonary:     Effort: Pulmonary effort is normal.     Breath sounds: Normal breath sounds. No wheezing, rhonchi or rales.  Musculoskeletal:        General: Normal range of motion.  Skin:    General: Skin is warm and dry.  Neurological:     General: No focal deficit present.     Mental Status: She is alert and oriented to person, place, and time. Mental status is at baseline.  Psychiatric:        Mood and Affect: Mood normal.        Behavior: Behavior normal.        Thought Content: Thought content normal.        Judgment: Judgment normal.      Lab Results:  CBC    Component Value Date/Time   WBC 7.2 11/12/2023 1039   WBC 7.2 05/10/2023 1337   RBC 4.82 11/12/2023 1039   HGB 13.6 11/12/2023 1039   HCT 42.5 11/12/2023 1039   PLT 348 11/12/2023 1039   MCV 88.2 11/12/2023 1039   MCH 28.2 11/12/2023 1039   MCHC 32.0 11/12/2023 1039   RDW 14.2 11/12/2023 1039   LYMPHSABS 1.1 11/12/2023 1039   MONOABS 0.7  11/12/2023 1039   EOSABS 0.1 11/12/2023 1039   BASOSABS 0.1 11/12/2023 1039    BMET    Component Value Date/Time   NA 139 11/12/2023 1039   K 4.0 11/12/2023 1039   CL 105 11/12/2023 1039   CO2 28 11/12/2023 1039   GLUCOSE 94 11/12/2023 1039   BUN 11 11/12/2023  1039   CREATININE 0.96 11/12/2023 1039   CALCIUM 9.5 11/12/2023 1039   GFRNONAA >60 11/12/2023 1039    BNP No results found for: "BNP"  ProBNP No results found for: "PROBNP"  Imaging: No results found.   Assessment & Plan:   No problem-specific Assessment & Plan notes found for this encounter.   Assessment and Plan    Obstructive Sleep Apnea Hx obstructive sleep apnea dx >15 years ago, she was on CPAP for approximately 5 years but self discontinued due to issues with mask supplies and pressure settings. She reports continued OSA symptoms including snoring and daytime sleepiness. Dicussed risk of untreated sleep apnea and treatment options. Considering oral appliance therapy for mild to moderate cases. CPAP preferred for severe cases or oxygen desaturation.  - Order home sleep study through [facility] to assess current severity. - Discuss results in follow-up to determine treatment (CPAP vs. oral appliance). - Advise side sleeping to reduce apneic events. - Counsel on avoiding alcohol before bedtime. - Advise on safety measures for excessive daytime sleepiness.  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 Discussed potential for oral appliance for sleep apnea that could address bruxism. Oral appliances may stress TMJ joint. - Consider referral to orthodontic dentist for evaluation of oral appliance addressing both sleep apnea and bruxism, pending sleep study results.      Glenford Bayley, NP 01/02/2024

## 2024-01-02 NOTE — Patient Instructions (Addendum)
    -  OBSTRUCTIVE SLEEP APNEA: Obstructive sleep apnea is a condition where the airway becomes blocked during sleep, causing breathing pauses and snoring. We will conduct a home sleep study to assess the current severity of your sleep apnea. Based on the results, we will discuss whether a CPAP machine or an oral appliance is more suitable for you. In the meantime, try to sleep on your side to reduce breathing interruptions and avoid alcohol before bedtime. We will also provide information on orthodontic dentists if an oral appliance is recommended.  -BRUXISM: Bruxism is the grinding or clenching of teeth, often during sleep. You are currently using a dental appliance for this condition. We discussed the possibility of using an oral appliance that could address both sleep apnea and bruxism. This will be considered after we get the results of your sleep study. Be aware that oral appliances may put stress on the jaw joint, so a referral to an orthodontic dentist may be necessary.  INSTRUCTIONS: Please complete the home sleep study as ordered. We will review the results in a follow-up appointment to determine the best treatment plan for your sleep apnea. If an oral appliance is recommended, we will provide information on orthodontic dentists for fitting. Continue to avoid alcohol before bedtime and try to sleep on your side to reduce snoring and breathing interruptions.  Follow-up 8 weeks with Waynetta Sandy NP to review sleep study results

## 2024-01-10 ENCOUNTER — Encounter

## 2024-01-10 DIAGNOSIS — R0683 Snoring: Secondary | ICD-10-CM

## 2024-01-23 DIAGNOSIS — R069 Unspecified abnormalities of breathing: Secondary | ICD-10-CM | POA: Diagnosis not present

## 2024-02-08 ENCOUNTER — Telehealth: Payer: Self-pay

## 2024-02-08 NOTE — Telephone Encounter (Signed)
 Copied from CRM [id number]. Topic: General - Other >> Jan 29, 2024 10:28 AM Chantha C wrote: Reason for CRM: Patient is returning the office call, a  message was left but did not give any details and patient is unsure of the call. Please advise and call back at [phone].    ATC X1. LMTCB. I am not sure why the pt was called as I do not see any kind of documentation that she was called for anything.

## 2024-02-28 ENCOUNTER — Ambulatory Visit: Admitting: Primary Care

## 2024-02-28 VITALS — Ht 64.0 in | Wt 185.0 lb

## 2024-02-28 DIAGNOSIS — G4733 Obstructive sleep apnea (adult) (pediatric): Secondary | ICD-10-CM

## 2024-02-28 NOTE — Progress Notes (Signed)
 @Patient  ID: Janice Wagner, female    DOB: 09/02/1961, 63 y.o.   MRN: [id number]  Chief Complaint  Patient presents with   Sleep Study    Referring provider: Abbe Hoard., MD  HPI:  63 year old female, never smoked.  Past medical history significant for insulin resistance, OSA, breast cancer.  01/02/2024 Discussed the use of AI scribe software for clinical note transcription with the patient, who gave verbal consent to proceed.  History of Present Illness   Janice Wagner is a 63 year old female with a history of sleep apnea who presents for a sleep consult.  She has a history of sleep apnea and underwent a sleep study approximately fifteen years ago. She used a CPAP machine regularly for about five years but discontinued its use because she felt she needed more air and did not get it adjusted. Although the CPAP was beneficial, she stopped using it.  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 Additionally, she uses Invisalign for her bottom teeth. Without these devices, she snores more and sometimes wakes herself up snoring, especially when sleeping on her back or on the couch. Her granddaughter has also noted her snoring. She does not consume alcohol before bed and describes her sleep position as variable, switching between her sides and back.  She experiences daytime sleepiness, which was more pronounced when she was working. Now retired, she feels less pressure to stay awake during the day. Her bedtime is around 10 or 11 PM, and she wakes up three to four times a night to use the restroom, getting out of bed by 10 AM.  Her weight is roughly the same as it was fifteen years ago, although she has experienced some weight loss recently while on Ozempic. She has not noticed a significant change in her snoring with the weight loss.  No history of seizures, cardiac issues such as heart failure or heart attack, and does not  have COPD or use oxygen. No significant alcohol use before bedtime.     02/28/2024- Interim hx  Discussed the use of AI scribe software for clinical note transcription with the patient, who gave verbal consent to proceed.  History of Present Illness   Janice Wagner is a 63 year old female with sleep apnea who presents for a follow-up regarding her sleep apnea management.  She has a history of sleep apnea, diagnosed 15 years ago, and initially managed with a CPAP machine for about five years. She discontinued its use due to discomfort with pressure settings, despite its benefits. Currently, she uses a mouth guard for bruxism, which helps reduce snoring, and Invisalign for teeth alignment and grinding.  A recent home sleep study in April indicated an average of 9.5 apneic events per hour with a lowest oxygen saturation of 85%. She experiences occasional apneas, particularly when supine, which cause her to wake up choking. Her granddaughter sometimes alerts her to snoring when she naps on the couch without her appliances.  She was hospitalized three weeks ago for acute kidney injury and a possible E. coli infection. She was treated with Cipro , Flagyl, PPI, and Carafate. She has been off Ozempic for four weeks, which she had been using for weight loss, and is uncertain about restarting it. She reports weight loss with Ozempic prior to her hospitalization.  She has a large liver cyst that requires follow-up, and she is scheduled to see a specialist at [facility]. She  has no significant cardiac history such as atrial fibrillation or congestive heart failure. Her weight is stable, with some recent weight loss.      HST 01/10/24>> AHI 9.5, spo2 low 85%. Patient spent 0.31min with O2 <88%   Allergies  Allergen Reactions   Penicillins     Immunization History  Administered Date(s) Administered   Influenza Split 07/09/2015, 06/27/2017   Influenza,inj,Quad PF,6+ Mos 06/29/2021, 06/22/2022    Influenza-Unspecified 06/21/2018, 06/24/2020, 07/19/2020   Moderna Sars-Covid-2 Vaccination 11/20/2019, 12/22/2019, 09/02/2020, 08/12/2021   Tdap 09/26/2011, 04/12/2018   Zoster, Live 10/17/2019    Past Medical History:  Diagnosis Date   Arthritis    Empty sella syndrome (HCC)    Family history of breast cancer 06/28/2022   Family history of colon cancer 06/28/2022   GERD (gastroesophageal reflux disease)    H/O: vasectomy    husband with vasectomy   Hypertension    Insulin resistance    Migraines    Ovarian cyst    Reflux    Sleep apnea    has used CPAP, not currently    Tobacco History: Social History   Tobacco Use  Smoking Status Never  Smokeless Tobacco Never   Counseling given: Not Answered   Outpatient Medications Prior to Visit  Medication Sig Dispense Refill   ALPRAZolam (XANAX) 0.25 MG tablet Take one by mouth at night prn sleep/anxiety.  Medicaton refills require office visit every 6 months.     amLODipine (NORVASC) 5 MG tablet Take 5 mg by mouth daily.     Calcium Carb-Cholecalciferol 600-12.5 MG-MCG CAPS Take by mouth.     celecoxib (CELEBREX) 200 MG capsule Take 200 mg by mouth daily.     Cetirizine HCl (ZYRTEC ALLERGY PO) Take by mouth.     cyanocobalamin (VITAMIN B12) 1000 MCG tablet Take by mouth.     DULoxetine (CYMBALTA) 30 MG capsule Take 30 mg by mouth daily.     exemestane  (AROMASIN ) 25 MG tablet Take 1 tablet (25 mg total) by mouth daily after breakfast. 90 tablet 1   famotidine (PEPCID) 40 MG tablet Take 40 mg by mouth 2 (two) times daily.     lisinopril (ZESTRIL) 20 MG tablet Take 20 mg by mouth daily.     metFORMIN (GLUCOPHAGE-XR) 500 MG 24 hr tablet Take 500 mg by mouth 2 (two) times daily.     ondansetron  (ZOFRAN ) 4 MG tablet Take 1 tablet (4 mg total) by mouth every 8 (eight) hours as needed for nausea or vomiting. 20 tablet 0   ondansetron  (ZOFRAN -ODT) 4 MG disintegrating tablet Take 4 mg by mouth every 8 (eight) hours as needed for nausea  or vomiting.     topiramate (TOPAMAX) 25 MG tablet Take 25 mg by mouth daily.     OZEMPIC, 0.25 OR 0.5 MG/DOSE, 2 MG/3ML SOPN Inject 0.5mg  once a week (Patient not taking: Reported on 02/28/2024)     OZEMPIC, 1 MG/DOSE, 4 MG/3ML SOPN Inject into the skin. (Patient not taking: Reported on 02/28/2024)     No facility-administered medications prior to visit.    Review of Systems  Review of Systems  Constitutional:  Positive for fatigue.  HENT: Negative.    Respiratory: Negative.    Psychiatric/Behavioral:  Positive for sleep disturbance.      Physical Exam  Ht 5\' 4"  (1.626 m)   Wt 185 lb (83.9 kg)   BMI 31.76 kg/m  Physical Exam Constitutional:      Appearance: Normal appearance.  HENT:  Head: Normocephalic and atraumatic.  Cardiovascular:     Rate and Rhythm: Normal rate.  Pulmonary:     Effort: Pulmonary effort is normal.  Musculoskeletal:        General: Normal range of motion.  Skin:    General: Skin is warm and dry.  Neurological:     General: No focal deficit present.     Mental Status: She is alert and oriented to person, place, and time. Mental status is at baseline.  Psychiatric:        Mood and Affect: Mood normal.        Behavior: Behavior normal.        Thought Content: Thought content normal.        Judgment: Judgment normal.      Lab Results:  CBC    Component Value Date/Time   WBC 7.2 11/12/2023 1039   WBC 7.2 05/10/2023 1337   RBC 4.82 11/12/2023 1039   HGB 13.6 11/12/2023 1039   HCT 42.5 11/12/2023 1039   PLT 348 11/12/2023 1039   MCV 88.2 11/12/2023 1039   MCH 28.2 11/12/2023 1039   MCHC 32.0 11/12/2023 1039   RDW 14.2 11/12/2023 1039   LYMPHSABS 1.1 11/12/2023 1039   MONOABS 0.7 11/12/2023 1039   EOSABS 0.1 11/12/2023 1039   BASOSABS 0.1 11/12/2023 1039    BMET    Component Value Date/Time   NA 139 11/12/2023 1039   K 4.0 11/12/2023 1039   CL 105 11/12/2023 1039   CO2 28 11/12/2023 1039   GLUCOSE 94 11/12/2023 1039   BUN 11  11/12/2023 1039   CREATININE 0.96 11/12/2023 1039   CALCIUM 9.5 11/12/2023 1039   GFRNONAA >60 11/12/2023 1039    BNP No results found for: "BNP"  ProBNP No results found for: "PROBNP"  Imaging: No results found.   Assessment & Plan:   1. Mild obstructive sleep apnea (Primary) - Ambulatory Referral for DME  Assessment and Plan    Mild Obstructive Sleep Apnea Mild obstructive sleep apnea with an average of 9.5 apneic events per hour and oxygen desaturation to 85%, with minimal time below 88%. Minimal risk for cardiac arrhythmias, stroke, diabetes, pulmonary hypertension, and Alzheimer's due to mild severity. Experiences occasional apneas, particularly when supine, causing nocturnal awakenings with choking. No significant cardiac history. Engaged in shared decision-making regarding treatment options, including CPAP and oral appliance. Prefers CPAP, previously experienced improved sleep with it. CPAP is covered by insurance, deductible likely met due to recent hospitalization. Oral appliances typically not covered, costing up to $2000. - Order placed for auto CPAP 5-15cm h20  - Arrange mask fitting and provide options for nasal and hybrid masks - Educate on CPAP maintenance and cleaning - Discuss potential use of CPAP pillow for side sleeping  Acute Kidney Injury Recent hospitalization for acute kidney injury secondary to suspected E. coli infection. Currently off Ozempic due to illness and hospitalization. Follow-up with nephrology and gastroenterology planned.  Colitis Recent episode of colitis, suspected secondary to E. coli infection, leading to hospitalization. Treated with Cipro , Flagyl, PPI, and Carafate. Follow-up with gastroenterologist scheduled.  Liver Cyst Known large liver cyst. Follow-up with [facility] for further evaluation and management.  Recording duration: 26 minutes        Antonio Baumgarten, NP 02/28/2024

## 2024-02-28 NOTE — Patient Instructions (Signed)
 -MILD OBSTRUCTIVE SLEEP APNEA: Mild obstructive sleep apnea is a condition where your breathing stops and starts during sleep, causing occasional awakenings and snoring. We discussed treatment options, and you prefer to restart using a CPAP machine, which helps keep your airway open during sleep. We will order a CPAP machine for you, arrange a mask fitting, and provide options for nasal and hybrid masks. Additionally, we will educate you on CPAP maintenance and cleaning, and discuss the potential use of a CPAP pillow for side sleeping. Aim to wear CPAP nightly 4-6 hours.   INSTRUCTIONS: Please follow up with nephrology and gastroenterology as planned. Additionally, ensure you attend your appointment with the specialist at [facility] for your liver cyst evaluation. We will arrange for the CPAP machine and mask fitting, and you will be educated on its use and maintenance.  Orders CPAP 5-20cm h20 with mask of choice   Follow-up 6-8 weeks CPAP compliance    CPAP and BIPAP Information CPAP and BIPAP use air pressure to keep your airways open and help you breathe well. CPAP and BIPAP use different amounts of pressure. Your health care provider will tell you whether CPAP or BIPAP would be best for you. CPAP stands for continuous positive airway pressure. With CPAP, the amount of pressure stays the same while you breathe in and out. BIPAP stands for bi-level positive airway pressure. With BIPAP, the amount of pressure will be higher when you breathe in and lower when you breathe out. This allows you to take bigger breaths. CPAP or BIPAP may be used in the hospital or at home. You may need to have a sleep study before your provider can order a device for you to use at home. What are the advantages? CPAP and BIPAP are most often used for obstructive sleep apnea to keep the airways from collapsing when the muscles relax during sleep. CPAP or BIPAP can be used if you have: Chronic obstructive pulmonary  disease. Heart failure. Medical conditions that cause muscle weakness. Other problems that cause breathing to be shallow, weak, or difficult. What are the risks? Your provider will talk with you about risks. These may include: Sores on your nose or face caused from the mask, prongs, or nasal pillows. Dry or stuffy nose or nosebleeds. Feeling gassy or bloated. Sinus or lung infection if the equipment is not cleaned well. When should CPAP or BIPAP be used? In most cases, CPAP or BIPAP is used during sleep at night or whenever the main sleep time happens. It's also used during naps. People with some medical conditions may need to wear the mask when they're awake. Follow instructions from your provider about when to use your CPAP or BIPAP. What happens during CPAP or BIPAP?  Both CPAP and BIPAP use a small machine that uses electricity to create air pressure. A long tube connects the device to a plastic mask. Air is blown through the mask into your nose or mouth. The amount of pressure that's used to blow the air can be adjusted. Your provider will set the pressure setting and help you find the best mask for you. Tips for using the mask There are different types and sizes of masks. If your mask does not fit well, talk with your provider about getting a different one. Some common types of masks include: Full face masks, which fit over the mouth and nose. Nasal masks, which fit over the nose. Nasal pillow or prong masks, which fit into the nostrils. The mask needs to be  snug to your face, so some people feel trapped or closed in at first. If you feel this way, you may need to get used to the mask. Hold the mask loosely over your nose or mouth and then gradually put the the mask on more snugly. Slowly increase the amount of time you use the mask. If you have trouble with your mask not fitting well or leaking, talk with your provider. Do not stop using the mask. Tips for using the device Follow  instructions from your provider about how to and how often to use the device. For home use, CPAP and BIPAP devices come from home health care companies. There are many different brands. Your health insurance company will help to decide which device you get. Keep the CPAP or BIPAP device and attachments clean. Ask your home health care company or check the instruction book for cleaning instructions. Make sure the humidifier is filled with germ-free (sterile) water and is working correctly. This will help prevent a dry or stuffy nose or nosebleeds. A nasal saline mist or spray may keep your nose from getting dry and sore. Do not eat or drink while the CPAP or BIPAP device is on. Food or drinks could get pushed into your lungs by the pressure of the CPAP or BIPAP. Follow these instructions at home: Take over-the-counter and prescription medicines only as told by your provider. Do not smoke, vape, or use nicotine or tobacco. Contact a health care provider if: You have redness or pressure sores on your head, face, mouth, or nose from the mask or headgear. You have trouble using the CPAP or BIPAP device. You have trouble going to sleep or staying asleep. Someone tells you that you snore even when wearing your CPAP or BIPAP device. Get help right away if: You have trouble breathing. You feel confused. These symptoms may be an emergency. Get help right away. Call 911. Do not wait to see if the symptoms will go away. Do not drive yourself to the hospital. This information is not intended to replace advice given to you by your health care provider. Make sure you discuss any questions you have with your health care provider. Document Revised: 01/03/2023 Document Reviewed: 01/03/2023 Elsevier Patient Education  2024 [employer].

## 2024-03-17 ENCOUNTER — Ambulatory Visit: Attending: Surgery

## 2024-03-17 VITALS — Wt 183.2 lb

## 2024-03-17 DIAGNOSIS — Z483 Aftercare following surgery for neoplasm: Secondary | ICD-10-CM | POA: Insufficient documentation

## 2024-03-17 NOTE — Therapy (Signed)
 OUTPATIENT PHYSICAL THERAPY SOZO SCREENING NOTE   Patient Name: Janice Wagner MRN: [id number] DOB:05-06-1961, 63 y.o., female Today's Date: 03/17/2024  PCP: Thurmond Cathlyn LABOR., MD REFERRING PROVIDER: Vernetta Berg, MD   PT End of Session - 03/17/24 1023     Visit Number 2   # unchanged due to screen only   PT Start Time 1021    PT Stop Time 1028    PT Time Calculation (min) 7 min    Activity Tolerance Patient tolerated treatment well    Behavior During Therapy [facility] for tasks assessed/performed           Past Medical History:  Diagnosis Date   Arthritis    Empty sella syndrome (HCC)    Family history of breast cancer 06/28/2022   Family history of colon cancer 06/28/2022   GERD (gastroesophageal reflux disease)    H/O: vasectomy    husband with vasectomy   Hypertension    Insulin resistance    Migraines    Ovarian cyst    Reflux    Sleep apnea    has used CPAP, not currently   Past Surgical History:  Procedure Laterality Date   ABDOMINAL SURGERY     Laparotomy-Ovarian cyst   BREAST LUMPECTOMY WITH RADIOACTIVE SEED AND SENTINEL LYMPH NODE BIOPSY Right 07/14/2022   Procedure: RIGHT BREAST LUMPECTOMY WITH RADIOACTIVE SEED AND SENTINEL LYMPH NODE BIOPSY;  Surgeon: Vernetta Berg, MD;  Location: [facility];  Service: General;  Laterality: Right;   CHOLECYSTECTOMY     HERNIA REPAIR     VAGINAL HYSTERECTOMY  11/2002   VAGINAL HYST., POSTERIOR REPAIR   Patient Active Problem List   Diagnosis Date Noted   Genetic testing 07/04/2022   Family history of breast cancer 06/28/2022   Family history of colon cancer 06/28/2022   Malignant neoplasm of upper-outer quadrant of right breast in female, estrogen receptor positive (HCC) 06/26/2022   Ovarian cyst    Reflux    Arthritis    Insulin resistance    Empty sella syndrome (HCC)    H/O: vasectomy     REFERRING DIAG: right breast cancer at risk for lymphedema  THERAPY DIAG: Aftercare following  surgery for neoplasm  PERTINENT HISTORY: Patient was diagnosed on 06/07/2022 with right grade 2 invasive ductal carcinoma breast cancer. It measures 1.8 cm and is located in the upper outer quadrant. It is ER/PR positive and HER negative with a Ki67 of 10%. She had a right lumpectomy with SLNB on 07/14/2022. Her Oncotype is 14. She will have her radiation simulation today and will have 4 weeks of radiation.   PRECAUTIONS: right UE Lymphedema risk, None  SUBJECTIVE: Pt returns for her 3 month L-Dex screen.   PAIN:  Are you having pain? No  SOZO SCREENING: Patient was assessed today using the SOZO machine to determine the lymphedema index score. This was compared to her baseline score. It was determined that she is within the recommended range when compared to her baseline and no further action is needed at this time. She will continue SOZO screenings. These are done every 3 months for 2 years post operatively followed by every 6 months for 2 years, and then annually.   L-DEX FLOWSHEETS - 03/17/24 1000       L-DEX LYMPHEDEMA SCREENING   Measurement Type Unilateral    L-DEX MEASUREMENT EXTREMITY Upper Extremity    POSITION  Standing    DOMINANT SIDE Right    At Risk Side Right  BASELINE SCORE (UNILATERAL) 5.1    L-DEX SCORE (UNILATERAL) 3.4    VALUE CHANGE (UNILAT) -1.7          P: Cont every 3 month L-Dex screens until ~Oct 2025, then transition to every 6 months x 2 years.   Aden Berwyn Caldron, PTA 03/17/2024, 10:28 AM

## 2024-04-30 NOTE — Result Encounter Note (Signed)
 Result reviewed with Dr. Nadara. Given unremarkable imaging and benign pathology from cyst fenestration, no further action needed from a surgical standpoint.  Call pt to review results. She has upcoming follow up with her local gastroenterologist, Dr. Burnette, as well as oncology due to her breast cancer history. Advised her to notify these teams of her labs and our work up thus far, and contact us  if questions arise.

## 2024-05-05 ENCOUNTER — Other Ambulatory Visit: Payer: Self-pay | Admitting: Nurse Practitioner

## 2024-05-07 ENCOUNTER — Other Ambulatory Visit (HOSPITAL_COMMUNITY): Payer: Self-pay | Admitting: Gastroenterology

## 2024-05-07 DIAGNOSIS — R6881 Early satiety: Secondary | ICD-10-CM

## 2024-05-09 ENCOUNTER — Other Ambulatory Visit: Payer: Self-pay

## 2024-05-09 DIAGNOSIS — Z170 Estrogen receptor positive status [ER+]: Secondary | ICD-10-CM

## 2024-05-11 NOTE — Progress Notes (Signed)
 Patient Care Team: Thurmond Cathlyn LABOR., MD as PCP - General (Internal Medicine) Glean Stephane BROCKS, RN (Inactive) as Oncology Nurse Navigator Tyree Nanetta SAILOR, RN as Oncology Nurse Navigator Vernetta Berg, MD as Consulting Physician (General Surgery) Lanny Callander, MD as Consulting Physician (Hematology) Dewey Rush, MD as Consulting Physician (Radiation Oncology) Towana Charleston, MD (Gastroenterology) Burton, [name] K, NP as Nurse Practitioner (Nurse Practitioner)  Clinic Day:  05/12/2024  Referring physician: Thurmond Cathlyn LABOR., MD  ASSESSMENT & PLAN:   Assessment & Plan: Malignant neoplasm of upper-outer quadrant of right breast in female, 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 - Oncotype recurrence score 14, low risk disease, chemotherapy is not recommended. -she started adjuvant exemestane  in Feb 2024, tolerating well with mild hot flashes and joint aches.  -Mammo 05/2023 was benign -Ms. Menard is clinically doing well, tolerating AI.  Exam is benign, labs are normal.  Overall no clinical concern for breast cancer recurrence.  Continue surveillance and exemestane , I refilled -Recommend warm warm soaks/sitz bath for perineal lesion/boil -Follow-up in 6 months, or sooner if needed    Liver cyst Last visit, patient was hospitalized for gastroenteritis.  Imaging study showed liver lesion for which she is being followed at [facility].  Lesion is 4.5 x 2.5 x 1.5 cm in diameter. They are also monitoring her CA 19-9 level which continues to rise slightly. Biopsy has been performed which was negative for malignancy.  Liver functions have been normal.  Plan Labs reviewed. - CBC and CMP both unremarkable. Most recent mammogram done September 2024 was benign. She is scheduled for mammogram in September 2025. Continue exemestane   daily. Labs and follow-up in 6 months, sooner if needed.  The patient understands the plans discussed today and is in agreement with them.  She knows to contact our office if she develops concerns prior to her next appointment.  I provided 25 minutes of face-to-face time during this encounter and > 50% was spent counseling as documented under my assessment and plan.    Powell FORBES Lessen, NP  [facility] - A DEPT OF [facility] [address] [place] [address] Dept: [phone] Dept Fax: [phone]   No orders of the defined types were placed in this encounter.     CHIEF COMPLAINT:  CC: Right breast cancer, estrogen receptor positive  Current Treatment: Exemestane  -started 10/2022  INTERVAL HISTORY:  Jennica is here today for repeat clinical assessment.  She last saw [name], NP on 11/12/2023.  Most recent mammogram done 06/25/2023.  She has a liver lesion which is being monitored at [facility].  States she was hospitalized since her last visit due to gastroenteritis.  Liver lesion was found during hospitalization.  It measures 1.5 x 2.5 x 1.5 cm in diameter.  Her CA 19-9 levels have also risen steadily.  A biopsy has been performed of the liver lesion and it was negative for malignancy.  Most recent level done in June 2025 was 62.  She does have persistent soreness in the right upper quadrant of the abdomen.  She is having a gastric emptying study on 05/13/2024.  She denies nausea, vomiting, diarrhea, or constipation.  She does have excess gas accompanied by belching and burping.  She denies chest pain, chest pressure, or shortness of breath. She denies headaches or visual disturbances.  She denies fevers or chills. She denies pain. Her appetite is good. Her weight has increased 6 pounds over last 3 months.  I have reviewed the past medical history, past surgical history, social history and family history with the patient and they  are unchanged from previous note.  ALLERGIES:  is allergic to penicillins.  MEDICATIONS:  Current Outpatient Medications  Medication Sig Dispense Refill   ALPRAZolam (XANAX) 0.25 MG tablet Take one by mouth at night prn sleep/anxiety.  Medicaton refills require office visit every 6 months.     amLODipine (NORVASC) 5 MG tablet Take 5 mg by mouth daily.     Calcium Carb-Cholecalciferol 600-12.5 MG-MCG CAPS Take by mouth.     celecoxib (CELEBREX) 200 MG capsule Take 200 mg by mouth daily.     Cetirizine HCl (ZYRTEC ALLERGY PO) Take by mouth.     cyanocobalamin (VITAMIN B12) 1000 MCG tablet Take by mouth.     DULoxetine (CYMBALTA) 30 MG capsule Take 30 mg by mouth daily.     exemestane  (AROMASIN ) 25 MG tablet Take 1 tablet (25 mg total) by mouth daily after breakfast. 90 tablet 1   famotidine (PEPCID) 40 MG tablet Take 40 mg by mouth 2 (two) times daily.     LINZESS 72 MCG capsule Take 72 mcg by mouth daily before breakfast.     lisinopril (ZESTRIL) 20 MG tablet Take 20 mg by mouth daily.     metFORMIN (GLUCOPHAGE-XR) 500 MG 24 hr tablet Take 500 mg by mouth 2 (two) times daily.     ondansetron  (ZOFRAN ) 4 MG tablet Take 1 tablet (4 mg total) by mouth every 8 (eight) hours as needed for nausea or vomiting. 20 tablet 0   ondansetron  (ZOFRAN -ODT) 4 MG disintegrating tablet Take 4 mg by mouth every 8 (eight) hours as needed for nausea or vomiting.     topiramate (TOPAMAX) 25 MG tablet Take 25 mg by mouth daily.     No current facility-administered medications for this visit.    HISTORY OF PRESENT ILLNESS:   Oncology History Overview Note   Cancer Staging  Malignant neoplasm of upper-outer quadrant of right breast in female, estrogen receptor positive (HCC) Staging form: Breast, AJCC 8th Edition - Clinical: Stage IA (cT1c, cN0, cM0, G2, ER+, PR+, HER2-) - Signed by Lanell Donald Stagger, PA-C on 06/26/2022 Stage prefix: Initial diagnosis Method of lymph node assessment: Clinical Histologic  grading system: 3 grade system - Pathologic stage from 07/14/2022: Stage IA (pT1c, pN0, cM0, G2, ER+, PR+, HER2-) - Signed by Lanny Callander, MD on 09/10/2022 Stage prefix: Initial diagnosis Histologic grading system: 3 grade system Residual tumor (R): R0 - None     Malignant neoplasm of upper-outer quadrant of right breast in female, estrogen receptor positive (HCC)  06/19/2022 Initial Biopsy   Diagnosis 1. Breast, right, needle core biopsy, 12 o'clock, 2cmfn INVASIVE MODERATELY DIFFERENTIATED DUCTAL ADENOCARCINOMA, GRADE 2 (3+2+1) MICROCALCIFICATIONS PRESENT NEGATIVE FOR LYMPHOVASCULAR INVASION TUMOR MEASURES 6 MM IN GREATEST LINEAR EXTENT 2. Breast, right, needle core biopsy, 9:30 o'clock, 6cmfn BENIGN BREAST WITH FIBROCYSTIC CHANGES INCLUDING STROMAL FIBROSIS AND USUAL DUCT HYPERPLASIA PSEUDOANGIOMATOUS STROMAL HYPERPLASIA (PASH) NEGATIVE FOR MICROCALCIFICATIONS NEGATIVE FOR CARCINOMA 3. Breast, right, needle core biopsy, 10 o'clock, 8cmfn COMPATIBLE WITH A BENIGN FIBROADENOMA RARE MICROCALCIFICATIONS PRESENT NEGATIVE FOR MALIGNANCY Diagnosis Note 1. An immunohistochemical stain for E-cadherin is performed with adequate control and is positive within the tumor supporting a ductal differentiation.  1. PROGNOSTIC INDICATORS Results: The tumor cells are equivocal for Her2 (2+). Her2 by  FISH will be performed and the results reported separately. Estrogen Receptor: 100%, POSITIVE, STRONG STAINING INTENSITY Progesterone Receptor: 90%, POSITIVE, STRONG STAINING INTENSITY Proliferation Marker Ki67: 10%  1. FLUORESCENCE IN-SITU HYBRIDIZATION Results: GROUP 5: HER2 **NEGATIVE**   06/26/2022 Initial Diagnosis   Malignant neoplasm of upper-outer quadrant of right breast in female, estrogen receptor positive (HCC)   06/26/2022 Cancer Staging   Staging form: Breast, AJCC 8th Edition - Clinical: Stage IA (cT1c, cN0, cM0, G2, ER+, PR+, HER2-) - Signed by Lanell Donald Stagger, PA-C on  06/26/2022 Stage prefix: Initial diagnosis Method of lymph node assessment: Clinical Histologic grading system: 3 grade system   07/07/2022 Genetic Testing   Negative hereditary cancer genetic testing: no pathogenic variants detected in [facility] CancerNext Expanded +RNAinsight Panel.  VUS in PTCH1 at p.P725R (c.2174C>G).   Report date is 07/07/2022.   The CancerNext-Expanded gene panel offered by [facility] and includes sequencing, rearrangement, and RNA analysis for the following 77 genes: AIP, ALK, APC, ATM, AXIN2, BAP1, BARD1, BLM, BMPR1A, BRCA1, BRCA2, BRIP1, CDC73, CDH1, CDK4, CDKN1B, CDKN2A, CHEK2, CTNNA1, DICER1, FANCC, FH, FLCN, GALNT12, KIF1B, LZTR1, MAX, MEN1, MET, MLH1, MSH2, MSH3, MSH6, MUTYH, NBN, NF1, NF2, NTHL1, PALB2, PHOX2B, PMS2, POT1, PRKAR1A, PTCH1, PTEN, RAD51C, RAD51D, RB1, RECQL, RET, SDHA, SDHAF2, SDHB, SDHC, SDHD, SMAD4, SMARCA4, SMARCB1, SMARCE1, STK11, SUFU, TMEM127, TP53, TSC1, TSC2, VHL and XRCC2 (sequencing and deletion/duplication); EGFR, EGLN1, HOXB13, KIT, MITF, PDGFRA, POLD1, and POLE (sequencing only); EPCAM and GREM1 (deletion/duplication only).    07/14/2022 Cancer Staging   Staging form: Breast, AJCC 8th Edition - Pathologic stage from 07/14/2022: Stage IA (pT1c, pN0, cM0, G2, ER+, PR+, HER2-) - Signed by Lanny Callander, MD on 09/10/2022 Stage prefix: Initial diagnosis Histologic grading system: 3 grade system Residual tumor (R): R0 - None    Imaging     07/14/2022 Oncotype testing   Recurrence Score: 14 4% distant recurrence risk at 9 years with AI/tamoxifen alone <1% chemo benefit   08/21/2022 - 09/15/2022 Radiation Therapy   Radiation Treatment Dates: 08/21/2022 through 09/15/2022 Site Technique Total Dose (Gy) Dose per Fx (Gy) Completed Fx Beam Energies  Breast, Right: Breast_R 3D 42.56/42.56 2.66 16/16 10X  Breast, Right: Breast_R_Bst specialPort 8/8 2 4/4 15E      02/06/2023 Survivorship   SCP delivered by [name] Burton, NP       REVIEW OF  SYSTEMS:   Constitutional: Denies fevers, chills or abnormal weight loss Eyes: Denies blurriness of vision Ears, nose, mouth, throat, and face: Denies mucositis or sore throat Respiratory: Denies cough, dyspnea or wheezes Cardiovascular: Denies palpitation, chest discomfort or lower extremity swelling Gastrointestinal:  Denies nausea, heartburn or change in bowel habits.  She does have right upper quadrant abdominal tenderness.  Excess gas and bloating. Skin: Denies abnormal skin rashes Lymphatics: Denies new lymphadenopathy or easy bruising Neurological:Denies numbness, tingling or new weaknesses Behavioral/Psych: Mood is stable, no new changes  All other systems were reviewed with the patient and are negative.   VITALS:   Today's Vitals   05/12/24 1102 05/12/24 1117  BP: 132/70   Pulse: 62   Resp: 17   Temp: 97.6 F (36.4 C)   SpO2: 99%   Weight: 189 lb 12.8 oz (86.1 kg)   PainSc:  0-No pain   Body mass index is 32.58 kg/m.   Wt Readings from Last 3 Encounters:  05/12/24 189 lb 12.8 oz (86.1 kg)  03/17/24 183 lb 4 oz (83.1 kg)  02/28/24 185 lb (83.9 kg)    Body mass index  is 32.58 kg/m.  Performance status (ECOG): 1 - Symptomatic but completely ambulatory  PHYSICAL EXAM:   GENERAL:alert, no distress and comfortable SKIN: skin color, texture, turgor are normal, no rashes or significant lesions EYES: normal, Conjunctiva are pink and non-injected, sclera clear OROPHARYNX:no exudate, no erythema and lips, buccal mucosa, and tongue normal  NECK: supple, thyroid normal size, non-tender, without nodularity LYMPH:  no palpable lymphadenopathy in the cervical, axillary or inguinal LUNGS: clear to auscultation and percussion with normal breathing effort HEART: regular rate & rhythm and no murmurs and no lower extremity edema ABDOMEN:abdomen soft with normal bowel sounds. There is tenderness with palpation of the RUQ and epigastric regions of the abdomen.   Musculoskeletal:no cyanosis of digits and no clubbing  NEURO: alert & oriented x 3 with fluent speech, no focal motor/sensory deficits BREAST: There is well-healed lumpectomy scar on the right breast.  There are no palpable masses or lumps today.  Expected skin changes associated with radiation are noted.  There is no nipple inversion or nipple discharge.  There is no axillary lymphadenopathy on the right.  There are no palpable masses or lumps in the left breast.  There is no nipple inversion or nipple discharge.  There is no axillary lymphadenopathy on the left.  LABORATORY DATA:  I have reviewed the data as listed    Component Value Date/Time   NA 139 05/12/2024 1035   K 4.4 05/12/2024 1035   CL 106 05/12/2024 1035   CO2 27 05/12/2024 1035   GLUCOSE 98 05/12/2024 1035   BUN 14 05/12/2024 1035   CREATININE 1.05 (H) 05/12/2024 1035   CALCIUM 9.6 05/12/2024 1035   PROT 6.9 05/12/2024 1035   ALBUMIN 4.3 05/12/2024 1035   AST 17 05/12/2024 1035   ALT 14 05/12/2024 1035   ALKPHOS 98 05/12/2024 1035   BILITOT 0.7 05/12/2024 1035   GFRNONAA >60 05/12/2024 1035    Lab Results  Component Value Date   WBC 6.5 05/12/2024   NEUTROABS 4.6 05/12/2024   HGB 12.8 05/12/2024   HCT 39.6 05/12/2024   MCV 88.2 05/12/2024   PLT 320 05/12/2024     RADIOGRAPHIC STUDIES: NM GASTRIC EMPTYING Result Date: 05/14/2024 CLINICAL DATA:  Nausea and vomiting after eating. Abdominal pain after eating. EXAM: NUCLEAR MEDICINE GASTRIC EMPTYING SCAN TECHNIQUE: After oral ingestion of radiolabeled meal, sequential abdominal images were obtained for 4 hours. Percentage of activity emptying the stomach was calculated at 1 hour, 2 hour, 3 hour, and 4 hours. RADIOPHARMACEUTICALS:  2.16 mCi Tc-34m sulfur  colloid in standardized meal COMPARISON:  None Available. FINDINGS: Expected location of the stomach in the left upper quadrant. Ingested meal empties the stomach gradually over the course of the study. 72% emptied at  1 hr ( normal >= 10%) 90% emptied at 2 hr ( normal >= 40%) 97% emptied at 3 hr ( normal >= 70%) 4 hour emptying not obtained as the stomach was 97% empty at 3 hours. IMPRESSION: Normal gastric emptying study. Electronically Signed   By: Norman Gatlin M.D.   On: 05/14/2024 20:58

## 2024-05-11 NOTE — Assessment & Plan Note (Addendum)
 estrogen receptor positive; Stage IA, p(T1c, N0), ER+/PR+/HER2-, Grade 2  -Diagnosed 05/2022 -S/p lumpectomy and SLNB on 07/14/22 and adjuvant radiation completing 09/15/22. - Oncotype recurrence score 14, low risk disease, chemotherapy is not recommended. -she started adjuvant exemestane  in Feb 2024, tolerating well with mild hot flashes and joint aches.  -Mammo 05/2023 was benign - She continues to tolerate exemestane  with minimal negative side effects.  Continue daily. -Mammogram scheduled for September 2025. -Labs and follow-up in 6 months, sooner if needed.

## 2024-05-12 ENCOUNTER — Inpatient Hospital Stay: Payer: 59 | Attending: Nurse Practitioner

## 2024-05-12 ENCOUNTER — Inpatient Hospital Stay: Payer: 59 | Admitting: Nurse Practitioner

## 2024-05-12 VITALS — BP 132/70 | HR 62 | Temp 97.6°F | Resp 17 | Wt 189.8 lb

## 2024-05-12 DIAGNOSIS — Z170 Estrogen receptor positive status [ER+]: Secondary | ICD-10-CM | POA: Insufficient documentation

## 2024-05-12 DIAGNOSIS — K7689 Other specified diseases of liver: Secondary | ICD-10-CM | POA: Diagnosis not present

## 2024-05-12 DIAGNOSIS — R232 Flushing: Secondary | ICD-10-CM | POA: Insufficient documentation

## 2024-05-12 DIAGNOSIS — Z1721 Progesterone receptor positive status: Secondary | ICD-10-CM | POA: Insufficient documentation

## 2024-05-12 DIAGNOSIS — Z79811 Long term (current) use of aromatase inhibitors: Secondary | ICD-10-CM | POA: Diagnosis not present

## 2024-05-12 DIAGNOSIS — Z923 Personal history of irradiation: Secondary | ICD-10-CM | POA: Insufficient documentation

## 2024-05-12 DIAGNOSIS — C50411 Malignant neoplasm of upper-outer quadrant of right female breast: Secondary | ICD-10-CM | POA: Insufficient documentation

## 2024-05-12 DIAGNOSIS — Z1732 Human epidermal growth factor receptor 2 negative status: Secondary | ICD-10-CM | POA: Insufficient documentation

## 2024-05-12 LAB — CMP (CANCER CENTER ONLY)
ALT: 14 U/L (ref 0–44)
AST: 17 U/L (ref 15–41)
Albumin: 4.3 g/dL (ref 3.5–5.0)
Alkaline Phosphatase: 98 U/L (ref 38–126)
Anion gap: 6 (ref 5–15)
BUN: 14 mg/dL (ref 8–23)
CO2: 27 mmol/L (ref 22–32)
Calcium: 9.6 mg/dL (ref 8.9–10.3)
Chloride: 106 mmol/L (ref 98–111)
Creatinine: 1.05 mg/dL — ABNORMAL HIGH (ref 0.44–1.00)
GFR, Estimated: >60 mL/min (ref >60)
Glucose, Bld: 98 mg/dL (ref 70–99)
Potassium: 4.4 mmol/L (ref 3.5–5.1)
Sodium: 139 mmol/L (ref 135–145)
Total Bilirubin: 0.7 mg/dL (ref 0.0–1.2)
Total Protein: 6.9 g/dL (ref 6.5–8.1)

## 2024-05-12 LAB — CBC WITH DIFFERENTIAL (CANCER CENTER ONLY)
Abs Immature Granulocytes: 0.03 K/uL (ref 0.00–0.07)
Basophils Absolute: 0.1 K/uL (ref 0.0–0.1)
Basophils Relative: 1 %
Eosinophils Absolute: 0.1 K/uL (ref 0.0–0.5)
Eosinophils Relative: 2 %
HCT: 39.6 % (ref 36.0–46.0)
Hemoglobin: 12.8 g/dL (ref 12.0–15.0)
Immature Granulocytes: 1 %
Lymphocytes Relative: 15 %
Lymphs Abs: 1 K/uL (ref 0.7–4.0)
MCH: 28.5 pg (ref 26.0–34.0)
MCHC: 32.3 g/dL (ref 30.0–36.0)
MCV: 88.2 fL (ref 80.0–100.0)
Monocytes Absolute: 0.7 K/uL (ref 0.1–1.0)
Monocytes Relative: 10 %
Neutro Abs: 4.6 K/uL (ref 1.7–7.7)
Neutrophils Relative %: 71 %
Platelet Count: 320 K/uL (ref 150–400)
RBC: 4.49 MIL/uL (ref 3.87–5.11)
RDW: 14.1 % (ref 11.5–15.5)
WBC Count: 6.5 K/uL (ref 4.0–10.5)
nRBC: 0 % (ref 0.0–0.2)

## 2024-05-13 ENCOUNTER — Ambulatory Visit (HOSPITAL_COMMUNITY)
Admission: RE | Admit: 2024-05-13 | Discharge: 2024-05-13 | Disposition: A | Discharge status: Home / Self Care | Source: Ambulatory Visit | Attending: Gastroenterology | Admitting: Gastroenterology

## 2024-05-13 DIAGNOSIS — R6881 Early satiety: Secondary | ICD-10-CM | POA: Diagnosis present

## 2024-05-13 MED ORDER — TECHNETIUM TC 99M SULFUR COLLOID
2.1600 | Freq: Once | INTRAVENOUS | Status: AC | PRN
  Administered 2024-05-13: 2.16 via ORAL

## 2024-05-18 ENCOUNTER — Encounter: Payer: Self-pay | Admitting: Nurse Practitioner

## 2024-06-04 ENCOUNTER — Ambulatory Visit: Admitting: Primary Care

## 2024-06-04 VITALS — BP 120/80 | HR 72 | Ht 64.0 in | Wt 188.2 lb

## 2024-06-04 DIAGNOSIS — G4733 Obstructive sleep apnea (adult) (pediatric): Secondary | ICD-10-CM

## 2024-06-04 NOTE — Patient Instructions (Signed)
  VISIT SUMMARY: Today, we discussed your progress with CPAP therapy for obstructive sleep apnea. You have been using the CPAP machine consistently and effectively, which has significantly improved your sleep quality, cognition, and energy levels. Your condition is well controlled, and you are at minimal risk for complications due to your excellent compliance with the therapy.  YOUR PLAN: -OBSTRUCTIVE SLEEP APNEA: Obstructive sleep apnea is a condition where your airway becomes blocked during sleep, causing breathing pauses. Your CPAP therapy is well controlled, and you are using the machine 100% of the time with an average of 7 hours and 43 minutes per night. Continue using the CPAP machine nightly. Change the CPAP supplies regularly: cushion every 2-4 weeks, tubing every 3 months, headgear and water chamber every 6 months, and filter once a month. Use distilled water in the CPAP machine. Replace the CPAP machine every 5 years as covered by insurance. Notify the cruise line 30 days before sailing if special accommodations are needed for CPAP use. Reach out to the clinic if any issues arise with CPAP therapy.  INSTRUCTIONS: Please continue using your CPAP machine nightly and follow the schedule for changing supplies. If you need any special accommodations for your CPAP machine on your upcoming cruise, notify the cruise line 30 days before sailing. Contact our clinic if you experience any issues with your CPAP therap  Follow-up: 1 year with [facility] NP or sooner if needed

## 2024-06-04 NOTE — Progress Notes (Signed)
 @Patient  ID: Janice Wagner, female    DOB: [birth year], 62 y.o.   MRN: [id number]  No chief complaint on file.   Referring provider: Thurmond Cathlyn LABOR., MD  HPI: 63 year old female, never smoked. PMH significant breast cancer, insulin resistance, arthritis and mild sleep apnea.   Previous [facility] pulmonary encounter: 01/02/2024 Discussed the use of AI scribe software for clinical note transcription with the patient, who gave verbal consent to proceed.   History of Present Illness   Janice Wagner is a 63 year old female with a history of sleep apnea who presents for a sleep consult.   She has a history of sleep apnea and underwent a sleep study approximately fifteen years ago. She used a CPAP machine regularly for about five years but discontinued its use because she felt she needed more air and did not get it adjusted. Although the CPAP was beneficial, she stopped using it.   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 Additionally, she uses Invisalign for her bottom teeth. Without these devices, she snores more and sometimes wakes herself up snoring, especially when sleeping on her back or on the couch. Her granddaughter has also noted her snoring. She does not consume alcohol before bed and describes her sleep position as variable, switching between her sides and back.   She experiences daytime sleepiness, which was more pronounced when she was working. Now retired, she feels less pressure to stay awake during the day. Her bedtime is around 10 or 11 PM, and she wakes up three to four times a night to use the restroom, getting out of bed by 10 AM.   Her weight is roughly the same as it was fifteen years ago, although she has experienced some weight loss recently while on Ozempic. She has not noticed a significant change in her snoring with the weight loss.   No history of seizures, cardiac issues such as heart failure or heart  attack, and does not have COPD or use oxygen. No significant alcohol use before bedtime.       02/28/2024 Discussed the use of AI scribe software for clinical note transcription with the patient, who gave verbal consent to proceed.   History of Present Illness   Janice Wagner is a 63 year old female with sleep apnea who presents for a follow-up regarding her sleep apnea management.   She has a history of sleep apnea, diagnosed 15 years ago, and initially managed with a CPAP machine for about five years. She discontinued its use due to discomfort with pressure settings, despite its benefits. Currently, she uses a mouth guard for bruxism, which helps reduce snoring, and Invisalign for teeth alignment and grinding.   A recent home sleep study in April indicated an average of 9.5 apneic events per hour with a lowest oxygen saturation of 85%. She experiences occasional apneas, particularly when supine, which cause her to wake up choking. Her granddaughter sometimes alerts her to snoring when she naps on the couch without her appliances.   She was hospitalized three weeks ago for acute kidney injury and a possible E. coli infection. She was treated with Cipro , Flagyl, PPI, and Carafate. She has been off Ozempic for four weeks, which she had been using for weight loss, and is uncertain about restarting it. She reports weight loss with Ozempic prior to her hospitalization.   She has a large liver cyst that requires follow-up, and  she is scheduled to see a specialist at [facility]. She has no significant cardiac history such as atrial fibrillation or congestive heart failure. Her weight is stable, with some recent weight loss.    1. Mild obstructive sleep apnea (Primary) - Ambulatory Referral for DME   Assessment and Plan    Mild Obstructive Sleep Apnea Mild obstructive sleep apnea with an average of 9.5 apneic events per hour and oxygen desaturation to 85%, with minimal time below 88%. Minimal risk for  cardiac arrhythmias, stroke, diabetes, pulmonary hypertension, and Alzheimer's due to mild severity. Experiences occasional apneas, particularly when supine, causing nocturnal awakenings with choking. No significant cardiac history. Engaged in shared decision-making regarding treatment options, including CPAP and oral appliance. Prefers CPAP, previously experienced improved sleep with it. CPAP is covered by insurance, deductible likely met due to recent hospitalization. Oral appliances typically not covered, costing up to $2000. - Order placed for auto CPAP 5-15cm h20  - Arrange mask fitting and provide options for nasal and hybrid masks - Educate on CPAP maintenance and cleaning - Discuss potential use of CPAP pillow for side sleeping   Acute Kidney Injury Recent hospitalization for acute kidney injury secondary to suspected E. coli infection. Currently off Ozempic due to illness and hospitalization. Follow-up with nephrology and gastroenterology planned.   Colitis Recent episode of colitis, suspected secondary to E. coli infection, leading to hospitalization. Treated with Cipro , Flagyl, PPI, and Carafate. Follow-up with gastroenterologist scheduled.   Liver Cyst Known large liver cyst. Follow-up with [facility] for further evaluation and management.      06/04/2024- Interim hx  Discussed the use of AI scribe software for clinical note transcription with the patient, who gave verbal consent to proceed.  History of Present Illness Janice Wagner is a 63 year old female with obstructive sleep apnea who presents for a follow-up on CPAP therapy.  She has been using CPAP therapy since June with excellent compliance, using the machine 100% of the time for an average of 7 hours and 43 minutes per night. The CPAP is set to auto settings with a minimum pressure of 5 and a maximum of 15, with an average pressure of 7 and occasionally reaching 11. No significant air leaks are reported, and her apnea score is  0.8.  She uses a nasal mask after nasal pillows aggravated her nose. She reports improved sleep quality, cognition, and energy levels since using the CPAP. She has only missed one night of use since starting therapy.  She has received supplies once and recently got a new nasal mask. She uses distilled water in the machine. She has a backup machine for travel or use at a second residence. She plans to take the CPAP on a cruise with her family in December and has previously traveled with the CPAP without issues.  Airview download 05/05/24-06/03/24 Usage days 30/30 days (100%); 28 days (93%) Average usage 7 hours 38 mins  Pressure 14-20cm h20 Airleaks 89L/min AHI 2.7    Sleep testing:  HST 01/10/24>> AHI 9.5, spo2 low 85%. Patient spent 0.5min with O2 <88%   Allergies  Allergen Reactions   Penicillins     Immunization History  Administered Date(s) Administered   Influenza Split 07/09/2015, 06/27/2017   Influenza,inj,Quad PF,6+ Mos 06/29/2021, 06/22/2022   Influenza-Unspecified 06/21/2018, 06/24/2020, 07/19/2020   Moderna Sars-Covid-2 Vaccination 11/20/2019, 12/22/2019, 09/02/2020, 08/12/2021   Tdap 09/26/2011, 04/12/2018   Zoster, Live 10/17/2019    Past Medical History:  Diagnosis Date   Arthritis  Empty sella syndrome (HCC)    Family history of breast cancer 06/28/2022   Family history of colon cancer 06/28/2022   GERD (gastroesophageal reflux disease)    H/O: vasectomy    husband with vasectomy   Hypertension    Insulin resistance    Migraines    Ovarian cyst    Reflux    Sleep apnea    has used CPAP, not currently    Tobacco History: Social History   Tobacco Use  Smoking Status Never  Smokeless Tobacco Never   Counseling given: Not Answered   Outpatient Medications Prior to Visit  Medication Sig Dispense Refill   ALPRAZolam (XANAX) 0.25 MG tablet Take one by mouth at night prn sleep/anxiety.  Medicaton refills require office visit every 6 months.      amLODipine (NORVASC) 5 MG tablet Take 5 mg by mouth daily.     Calcium Carb-Cholecalciferol 600-12.5 MG-MCG CAPS Take by mouth.     celecoxib (CELEBREX) 200 MG capsule Take 200 mg by mouth daily.     Cetirizine HCl (ZYRTEC ALLERGY PO) Take by mouth.     cyanocobalamin (VITAMIN B12) 1000 MCG tablet Take by mouth.     DULoxetine (CYMBALTA) 30 MG capsule Take 30 mg by mouth daily.     exemestane  (AROMASIN ) 25 MG tablet Take 1 tablet (25 mg total) by mouth daily after breakfast. 90 tablet 1   famotidine (PEPCID) 40 MG tablet Take 40 mg by mouth 2 (two) times daily.     LINZESS 72 MCG capsule Take 72 mcg by mouth daily before breakfast.     lisinopril (ZESTRIL) 20 MG tablet Take 20 mg by mouth daily.     metFORMIN (GLUCOPHAGE-XR) 500 MG 24 hr tablet Take 500 mg by mouth 2 (two) times daily.     ondansetron  (ZOFRAN ) 4 MG tablet Take 1 tablet (4 mg total) by mouth every 8 (eight) hours as needed for nausea or vomiting. 20 tablet 0   ondansetron  (ZOFRAN -ODT) 4 MG disintegrating tablet Take 4 mg by mouth every 8 (eight) hours as needed for nausea or vomiting.     topiramate (TOPAMAX) 25 MG tablet Take 25 mg by mouth daily.     No facility-administered medications prior to visit.   Review of Systems  Review of Systems  Constitutional: Negative.   Respiratory: Negative.    Psychiatric/Behavioral:  Negative for sleep disturbance.    Physical Exam  There were no vitals taken for this visit. Physical Exam Constitutional:      Appearance: Normal appearance. She is well-developed.  HENT:     Head: Normocephalic and atraumatic.     Mouth/Throat:     Mouth: Mucous membranes are moist.     Pharynx: Oropharynx is clear.  Eyes:     Pupils: Pupils are equal, round, and reactive to light.  Cardiovascular:     Rate and Rhythm: Normal rate and regular rhythm.     Heart sounds: Normal heart sounds. No murmur heard. Pulmonary:     Effort: Pulmonary effort is normal. No respiratory distress.     Breath  sounds: Normal breath sounds. No wheezing or rhonchi.  Abdominal:     General: Bowel sounds are normal.     Palpations: Abdomen is soft.     Tenderness: There is no abdominal tenderness.  Musculoskeletal:        General: Normal range of motion.     Cervical back: Normal range of motion and neck supple.  Skin:    General: Skin  is warm and dry.     Findings: No erythema or rash.  Neurological:     General: No focal deficit present.     Mental Status: She is alert and oriented to person, place, and time. Mental status is at baseline.  Psychiatric:        Mood and Affect: Mood normal.        Behavior: Behavior normal.        Thought Content: Thought content normal.        Judgment: Judgment normal.     Lab Results:  CBC    Component Value Date/Time   WBC 6.5 05/12/2024 1035   WBC 7.2 05/10/2023 1337   RBC 4.49 05/12/2024 1035   HGB 12.8 05/12/2024 1035   HCT 39.6 05/12/2024 1035   PLT 320 05/12/2024 1035   MCV 88.2 05/12/2024 1035   MCH 28.5 05/12/2024 1035   MCHC 32.3 05/12/2024 1035   RDW 14.1 05/12/2024 1035   LYMPHSABS 1.0 05/12/2024 1035   MONOABS 0.7 05/12/2024 1035   EOSABS 0.1 05/12/2024 1035   BASOSABS 0.1 05/12/2024 1035    BMET    Component Value Date/Time   NA 139 05/12/2024 1035   K 4.4 05/12/2024 1035   CL 106 05/12/2024 1035   CO2 27 05/12/2024 1035   GLUCOSE 98 05/12/2024 1035   BUN 14 05/12/2024 1035   CREATININE 1.05 (H) 05/12/2024 1035   CALCIUM 9.6 05/12/2024 1035   GFRNONAA >60 05/12/2024 1035    BNP No results found for: BNP  ProBNP No results found for: PROBNP  Imaging: NM GASTRIC EMPTYING Result Date: 05/14/2024 CLINICAL DATA:  Nausea and vomiting after eating. Abdominal pain after eating. EXAM: NUCLEAR MEDICINE GASTRIC EMPTYING SCAN TECHNIQUE: After oral ingestion of radiolabeled meal, sequential abdominal images were obtained for 4 hours. Percentage of activity emptying the stomach was calculated at 1 hour, 2 hour, 3 hour, and  4 hours. RADIOPHARMACEUTICALS:  2.16 mCi Tc-70m sulfur  colloid in standardized meal COMPARISON:  None Available. FINDINGS: Expected location of the stomach in the left upper quadrant. Ingested meal empties the stomach gradually over the course of the study. 72% emptied at 1 hr ( normal >= 10%) 90% emptied at 2 hr ( normal >= 40%) 97% emptied at 3 hr ( normal >= 70%) 4 hour emptying not obtained as the stomach was 97% empty at 3 hours. IMPRESSION: Normal gastric emptying study. Electronically Signed   By: Norman Gatlin M.D.   On: 05/14/2024 20:58     Assessment & Plan:   1. Mild obstructive sleep apnea (Primary) - Ambulatory Referral for DME  Assessment and Plan Assessment & Plan Obstructive sleep apnea Obstructive sleep apnea is well controlled with CPAP therapy. She uses CPAP 100% of the time, averaging 7 hours and 43 minutes per night. The CPAP is set on auto settings with a minimum pressure of 5 and a maximum of 15, with an average pressure of 7 and a maximum of 11. There are no significant air leaks, and the apnea score is 0.8, indicating excellent control. She reports improved sleep quality and cognition. There is minimal risk of complications such as cardiac arrhythmia, stroke, pulmonary hypertension, and diabetes due to well-controlled sleep apnea. She is compliant with therapy and has a backup machine available. - Continue CPAP therapy nightly at current pressure settings 5-15cm h20 - Change CPAP supplies regularly: cushion every 2-4 weeks, tubing every 3 months, headgear and water chamber every 6 months, filter once a month. - Use  distilled water in the CPAP machine. - Replace CPAP machine every 5 years as covered by insurance. - Notify cruise line 30 days before sailing if special accommodations are needed for CPAP use. - Reach out to the clinic if any issues arise with CPAP therapy. - FU in 1 year or sooner as needed   Almarie LELON Ferrari, NP 06/04/2024

## 2024-06-16 ENCOUNTER — Ambulatory Visit: Attending: Surgery

## 2024-06-16 VITALS — Wt 188.5 lb

## 2024-06-16 DIAGNOSIS — Z483 Aftercare following surgery for neoplasm: Secondary | ICD-10-CM | POA: Insufficient documentation

## 2024-06-16 NOTE — Therapy (Signed)
 OUTPATIENT PHYSICAL THERAPY SOZO SCREENING NOTE   Patient Name: Janice Wagner MRN: [id number] DOB:09/19/1961, 63 y.o., female Today's Date: 06/16/2024  PCP: Thurmond Cathlyn LABOR., MD REFERRING PROVIDER: Vernetta Berg, MD   PT End of Session - 06/16/24 [phone]     Visit Number 2   # unchanged due to screen only   PT Start Time 0920    PT Stop Time 0924    PT Time Calculation (min) 4 min    Activity Tolerance Patient tolerated treatment well    Behavior During Therapy [facility] for tasks assessed/performed           Past Medical History:  Diagnosis Date   Arthritis    Empty sella syndrome (HCC)    Family history of breast cancer 06/28/2022   Family history of colon cancer 06/28/2022   GERD (gastroesophageal reflux disease)    H/O: vasectomy    husband with vasectomy   Hypertension    Insulin resistance    Migraines    Ovarian cyst    Reflux    Sleep apnea    has used CPAP, not currently   Past Surgical History:  Procedure Laterality Date   ABDOMINAL SURGERY     Laparotomy-Ovarian cyst   BREAST LUMPECTOMY WITH RADIOACTIVE SEED AND SENTINEL LYMPH NODE BIOPSY Right 07/14/2022   Procedure: RIGHT BREAST LUMPECTOMY WITH RADIOACTIVE SEED AND SENTINEL LYMPH NODE BIOPSY;  Surgeon: Vernetta Berg, MD;  Location: [facility];  Service: General;  Laterality: Right;   CHOLECYSTECTOMY     HERNIA REPAIR     VAGINAL HYSTERECTOMY  11/2002   VAGINAL HYST., POSTERIOR REPAIR   Patient Active Problem List   Diagnosis Date Noted   Genetic testing 07/04/2022   Family history of breast cancer 06/28/2022   Family history of colon cancer 06/28/2022   Malignant neoplasm of upper-outer quadrant of right breast in female, estrogen receptor positive (HCC) 06/26/2022   Ovarian cyst    Reflux    Arthritis    Insulin resistance    Empty sella syndrome (HCC)    H/O: vasectomy     REFERRING DIAG: right breast cancer at risk for lymphedema  THERAPY DIAG: Aftercare following  surgery for neoplasm  PERTINENT HISTORY: Patient was diagnosed on 06/07/2022 with right grade 2 invasive ductal carcinoma breast cancer. It measures 1.8 cm and is located in the upper outer quadrant. It is ER/PR positive and HER negative with a Ki67 of 10%. She had a right lumpectomy with SLNB on 07/14/2022. Her Oncotype is 14. She will have her radiation simulation today and will have 4 weeks of radiation.   PRECAUTIONS: right UE Lymphedema risk, None  SUBJECTIVE: Pt returns for her last 6 month L-Dex screen.   PAIN:  Are you having pain? No  SOZO SCREENING: Patient was assessed today using the SOZO machine to determine the lymphedema index score. This was compared to her baseline score. It was determined that she is within the recommended range when compared to her baseline and no further action is needed at this time. She will continue SOZO screenings. These are done every 3 months for 2 years post operatively followed by every 6 months for 2 years, and then annually.   L-DEX FLOWSHEETS - 06/16/24 0900       L-DEX LYMPHEDEMA SCREENING   Measurement Type Unilateral    L-DEX MEASUREMENT EXTREMITY Upper Extremity    POSITION  Standing    DOMINANT SIDE Right    At Risk Side Right  BASELINE SCORE (UNILATERAL) 5.1    L-DEX SCORE (UNILATERAL) 3.7    VALUE CHANGE (UNILAT) -1.4          P: Transition to every 6 months x 2 years.   Aden Berwyn Caldron, PTA 06/16/2024, 9:25 AM

## 2024-07-01 ENCOUNTER — Encounter: Payer: Self-pay | Admitting: Hematology

## 2024-11-12 ENCOUNTER — Ambulatory Visit: Admitting: Hematology

## 2024-11-12 ENCOUNTER — Other Ambulatory Visit

## 2024-12-15 ENCOUNTER — Ambulatory Visit
